# Patient Record
Sex: Female | Born: 1940
Health system: Southern US, Community
[De-identification: ages and names within clinical notes are randomized; demographics above are authoritative.]

## PROBLEM LIST (undated history)

## (undated) DIAGNOSIS — M069 Rheumatoid arthritis, unspecified: Secondary | ICD-10-CM

## (undated) DIAGNOSIS — K5730 Diverticulosis of large intestine without perforation or abscess without bleeding: Secondary | ICD-10-CM

## (undated) DIAGNOSIS — T7840XA Allergy, unspecified, initial encounter: Secondary | ICD-10-CM

## (undated) DIAGNOSIS — J309 Allergic rhinitis, unspecified: Secondary | ICD-10-CM

## (undated) DIAGNOSIS — R922 Inconclusive mammogram: Secondary | ICD-10-CM

## (undated) DIAGNOSIS — M8580 Other specified disorders of bone density and structure, unspecified site: Secondary | ICD-10-CM

## (undated) DIAGNOSIS — R9230 Dense breasts, unspecified: Secondary | ICD-10-CM

## (undated) DIAGNOSIS — N951 Menopausal and female climacteric states: Secondary | ICD-10-CM

## (undated) HISTORY — DX: Allergic rhinitis, unspecified: J30.9

## (undated) HISTORY — DX: Rheumatoid arthritis, unspecified: M06.9

## (undated) HISTORY — DX: Dense breasts, unspecified: R92.30

## (undated) HISTORY — DX: Other specified disorders of bone density and structure, unspecified site: M85.80

## (undated) HISTORY — DX: Diverticulosis of large intestine without perforation or abscess without bleeding: K57.30

## (undated) HISTORY — DX: Inconclusive mammogram: R92.2

## (undated) HISTORY — PX: NO PAST SURGERIES: SHX2092

## (undated) HISTORY — DX: Allergy, unspecified, initial encounter: T78.40XA

## (undated) HISTORY — DX: Menopausal and female climacteric states: N95.1

---

## 1995-11-12 DIAGNOSIS — N951 Menopausal and female climacteric states: Secondary | ICD-10-CM

## 1995-11-12 HISTORY — DX: Menopausal and female climacteric states: N95.1

## 1999-01-19 ENCOUNTER — Other Ambulatory Visit: Admission: RE | Admit: 1999-01-19 | Discharge: 1999-01-19 | Payer: Self-pay | Admitting: Obstetrics & Gynecology

## 1999-10-26 ENCOUNTER — Ambulatory Visit (HOSPITAL_COMMUNITY): Admission: RE | Admit: 1999-10-26 | Discharge: 1999-10-26 | Payer: Self-pay | Admitting: Family Medicine

## 1999-10-26 ENCOUNTER — Encounter: Payer: Self-pay | Admitting: Family Medicine

## 2000-04-28 ENCOUNTER — Other Ambulatory Visit: Admission: RE | Admit: 2000-04-28 | Discharge: 2000-04-28 | Payer: Self-pay | Admitting: Obstetrics & Gynecology

## 2000-06-24 ENCOUNTER — Ambulatory Visit (HOSPITAL_COMMUNITY): Admission: RE | Admit: 2000-06-24 | Discharge: 2000-06-24 | Payer: Self-pay | Admitting: Gastroenterology

## 2001-06-15 ENCOUNTER — Other Ambulatory Visit: Admission: RE | Admit: 2001-06-15 | Discharge: 2001-06-15 | Payer: Self-pay | Admitting: Obstetrics and Gynecology

## 2002-12-02 ENCOUNTER — Other Ambulatory Visit: Admission: RE | Admit: 2002-12-02 | Discharge: 2002-12-02 | Payer: Self-pay | Admitting: Obstetrics and Gynecology

## 2005-03-14 ENCOUNTER — Other Ambulatory Visit: Admission: RE | Admit: 2005-03-14 | Discharge: 2005-03-14 | Payer: Self-pay | Admitting: Obstetrics and Gynecology

## 2006-11-11 LAB — HM COLONOSCOPY

## 2007-05-25 ENCOUNTER — Ambulatory Visit (HOSPITAL_COMMUNITY): Admission: RE | Admit: 2007-05-25 | Discharge: 2007-05-25 | Payer: Self-pay | Admitting: Gastroenterology

## 2011-03-26 NOTE — Op Note (Signed)
Linda Soto, Linda Soto               ACCOUNT NO.:  [id number]   MEDICAL RECORD NO.:  [id number]          PATIENT TYPE:  AMB   LOCATION:  ENDO                         FACILITY:  [facility]   PHYSICIAN:  Anselmo Rod, M.D.  DATE OF BIRTH:  09-Jun-1941   DATE OF PROCEDURE:  05/25/2007  DATE OF DISCHARGE:                               OPERATIVE REPORT   PROCEDURE PERFORMED:  Screening colonoscopy.   ENDOSCOPIST:  Dr. Anselmo Rod.   INSTRUMENT USED:  Pentax video colonoscope.   INDICATIONS FOR PROCEDURE:  A 70 year old white female undergoing a  screening colonoscopy. The patient was found to have guaiac-positive  stools, rule out colonic polyps, masses, etc.   PREPROCEDURE PREPARATION:  Informed consent was procured from the  patient. The patient was fasted for 8 hours prior to the procedure and  prepped with 32 OsmoPrep pills the night prior to the procedure. The  risks and benefits of the procedure including a 10% miss rate of cancer  and polyp were discussed with the patient as well.   PREPROCEDURE PHYSICAL:  The patient had stable vital signs. Neck supple.  Chest clear to auscultation. S1, S2 regular. Abdomen soft with normal  bowel sounds.   DESCRIPTION OF PROCEDURE:  The patient was placed in the left lateral  decubitus position and sedated with 100 mcg of Fentanyl and 10 mg of  Versed given intravenously in slow incremental doses. Once the patient  was adequately sedate and maintained on low-flow oxygen and continuous  cardiac monitoring, the Pentax video colonoscope was advanced from the  rectum to the cecum. There was a significant amount of residual stool in  the colon especially on the right side. Multiple washes were done. There  was evidence of sigmoid diverticulosis with a few scattered diverticula  in the right colon as well.  The appendiceal orifice and ileocecal valve  were visualized after multiple washes. The terminal ileum was not  visualized.  No masses or  polyps were seen. Retroflexion in rectum  revealed no abnormalities. The patient tolerated the procedure well  without immediate complications.   IMPRESSION:  1. Pandiverticulosis with more prominent changes in the sigmoid colon.  2. Significant amount of residual stool in the colon. Small lesions      could be missed.  3. Terminal ileum not visualized.   RECOMMENDATIONS:  1. Continue high fiber diet with liberal fluid intake.  2. Repeat colonoscopy in the next 5 years with NuLytely prep. If the      patient has any abnormal symptoms in the      interim, she is to contact the office immediately for further      recommendations.  3. Outpatient follow-up in the next week for repeat guaiac testing.      Further recommendations will be made at that time.      Anselmo Rod, M.D.  Electronically Signed     JNM/MEDQ  D:  05/25/2007  T:  05/25/2007  Job:  [id number]   cc:   Hal Morales, M.D.  Fax: [phone]

## 2011-03-29 NOTE — Procedures (Signed)
[facility]  Patient:    Linda Soto, Linda Soto                     MRN: [id number] Proc. Date: 06/24/00 Attending:  Anselmo Rod, M.D. CC:         Cecilio Asper, M.D.                           Procedure Report  DATE OF BIRTH:  13-Jan-1941.  REFERRING PHYSICIAN:  Cecilio Asper, M.D.  PROCEDURE PERFORMED:  Screening flexible sigmoidoscopy in a 71 year old female.  ENDOSCOPIST:  Anselmo Rod, M.D.  INSTRUMENT USED:  Olympus video colonoscope.  INDICATIONS FOR PROCEDURE:  Screening flexible sigmoidoscopy being done in a 70 year old female rule out colonic polyps, masses, etc.  PREPROCEDURE PREPARATION:  Informed consent was procured from the patient. The patient was fasted for eight hours prior to the procedure and prepped with two Fleet enemas the morning of the procedure.  PREPROCEDURE PHYSICAL:  The patient had stable vital signs.  Neck supple. Chest clear to auscultation.  S1, S2 regular.  Abdomen soft with normal abdominal bowel sounds.  DESCRIPTION OF PROCEDURE:  The patient was placed in the left lateral decubitus position and no sedation was used.  Once the patient was adequately positioned, the Olympus video colonoscope was advanced from the rectum to the 60 cm without difficulty.  Solid stool was seen at 60 cm and the scope could not be advanced beyond this point.  Except for a few scattered diverticula, no other abnormalities were seen.  The patient tolerated the procedure well without complication.  IMPRESSION: 1. Scattered left-sided diverticulosis. 2. No masses or polyps seen. 3. No evidence of hemorrhoids.  RECOMMENDATIONS: 1. The patient has been advised to increase the fluid and fiber in her diet. 2. Outpatient follow-up advised on p.r.n. basis.  Information brochures on    diverticulosis and diverticulitis have been given to the patient for her    education.  She is to follow up with Dr. Cleatrice Burke and to contact    me for further GI problems.  DD:  06/24/00 TD:  06/24/00 Job: [id number]

## 2012-03-30 ENCOUNTER — Ambulatory Visit (INDEPENDENT_AMBULATORY_CARE_PROVIDER_SITE_OTHER): Payer: Medicare Other | Admitting: Obstetrics and Gynecology

## 2012-03-30 ENCOUNTER — Telehealth: Payer: Self-pay | Admitting: Obstetrics and Gynecology

## 2012-03-30 ENCOUNTER — Telehealth: Payer: Self-pay

## 2012-03-30 ENCOUNTER — Encounter: Payer: Self-pay | Admitting: Obstetrics and Gynecology

## 2012-03-30 VITALS — BP 122/76 | Resp 16 | Ht 67.0 in | Wt 163.0 lb

## 2012-03-30 DIAGNOSIS — R922 Inconclusive mammogram: Secondary | ICD-10-CM | POA: Insufficient documentation

## 2012-03-30 DIAGNOSIS — Z124 Encounter for screening for malignant neoplasm of cervix: Secondary | ICD-10-CM

## 2012-03-30 DIAGNOSIS — L989 Disorder of the skin and subcutaneous tissue, unspecified: Secondary | ICD-10-CM

## 2012-03-30 NOTE — Telephone Encounter (Signed)
Tc to pt per referral to Dr. Theotis Burrow office for eval of left foot lesion sched 04/28/12@10 :30. Pt agrees.

## 2012-03-30 NOTE — Progress Notes (Signed)
Addended by: Lerry Liner D on: 03/30/2012 03:22 PM   Modules accepted: Orders

## 2012-03-30 NOTE — Telephone Encounter (Signed)
Tc to pt per telephone call. Allergies/Medications updated in epic system. Pt voices understanding.

## 2012-03-30 NOTE — Progress Notes (Signed)
Last Pap: 03/03/2009 WNL: No Regular Periods:no Contraception: per pt None  Monthly Breast exam:yes Tetanus<90yrs:no Nl.Bladder Function:yes Daily BMs:yes Healthy Diet:yes Calcium:yes Mammogram:yes Exercise:Yes 4-3 x a week  Seatbelt: yes Abuse at home: no Stressful work:no Sigmoid-colonoscopy: per pt "2007 WNL" Bone Density: Yes "2011" WNL Subjective:    Linda Soto is a 71 y.o. female G1P1001 who presents for annual exam.  C/ 6 month hx of painless lesion on dorsum of left foot.  Had to return for extra mammogram views  The following portions of the patient's history were reviewed and updated as appropriate: allergies, current medications, past family history, past medical history, past social history, past surgical history and problem list.  Review of Systems Pertinent items are noted in HPI. Gastrointestinal:No change in bowel habits, no abdominal pain, no rectal bleeding Genitourinary:negative for dysuria, frequency, hematuria, nocturia and urinary incontinence    Objective:     BP 122/76  Resp 16  Ht 5\' 7"  (1.702 m)  Wt 163 lb (73.936 kg)  BMI 25.53 kg/m2  Weight:  Wt Readings from Last 1 Encounters:  03/30/12 163 lb (73.936 kg)     BMI: Body mass index is 25.53 kg/(m^2). General Appearance: Alert, appropriate appearance for age. No acute distress HEENT: Grossly normal Neck / Thyroid: Supple, no masses, nodes or enlargement Lungs: clear to auscultation bilaterally Back: No CVA tenderness Breast Exam: No masses or nodes.No dimpling, nipple retraction or discharge. Cardiovascular: Regular rate and rhythm. S1, S2, no murmur Gastrointestinal: Soft, non-tender, no masses or organomegaly Pelvic Exam: External genitalia: normal general appearance Vaginal: atrophic mucosa Cervix: normal appearance Adnexa: non palpable Uterus: normal single, nontender Rectal: no masses Rectovaginal: not indicated and see above Lymphatic Exam: Non-palpable nodes in neck,  clavicular, axillary, or inguinal regions  Skin: no rash or abnormalities Neurologic: Normal gait and speech, no tremor  Psychiatric: Alert and oriented, appropriate affect.    Urinalysis:Not done      Assessment:    Asymptomatic vaginal atrophy contact bleeding, but no sx Dense breasts Left foot lesion   Plan:    All questions answered. Diagnosis explained in detail, including differential. breast density reviewed as an independent risk factor for breast cancer  Podiatry referral Pap with HR HPV Follow-up:  for annual exam

## 2012-03-30 NOTE — Telephone Encounter (Signed)
Chandra/[facility] pt/was seen today

## 2012-04-02 LAB — PAP IG AND HPV HIGH-RISK: HPV DNA High Risk: NOT DETECTED

## 2013-03-11 LAB — HM MAMMOGRAPHY

## 2013-08-19 ENCOUNTER — Encounter: Payer: Self-pay | Admitting: Internal Medicine

## 2013-08-19 ENCOUNTER — Ambulatory Visit (INDEPENDENT_AMBULATORY_CARE_PROVIDER_SITE_OTHER): Payer: Medicare PPO | Admitting: Internal Medicine

## 2013-08-19 VITALS — BP 122/72 | HR 86 | Temp 98.2°F | Ht 67.0 in | Wt 160.0 lb

## 2013-08-19 DIAGNOSIS — Z0000 Encounter for general adult medical examination without abnormal findings: Secondary | ICD-10-CM

## 2013-08-19 NOTE — Progress Notes (Signed)
Subjective:    Patient ID: Linda Soto, female    DOB: [birth year], 71 y.o.   MRN: [id number]  HPI New patient to me and our practice, here today to establish care  Here for medicare wellness  Diet: heart healthy Physical activity: sactive Depression/mood screen: negative Hearing: intact to whispered voice Visual acuity: grossly normal, performs annual eye exam  ADLs: capable Fall risk: none Home safety: good Cognitive evaluation: intact to orientation, naming, recall and repetition EOL planning: adv directives, full code/ I agree  I have personally reviewed and have noted 1. The patient's medical and social history 2. Their use of alcohol, tobacco or illicit drugs 3. Their current medications and supplements 4. The patient's functional ability including ADL's, fall risks, home safety risks and hearing or visual impairment. 5. Diet and physical activities 6. Evidence for depression or mood disorders   Past Medical History  Diagnosis Date  . Diverticular disease of left colon   . Menopausal symptom 1997  . Dense breasts     f/u mammo rec 2013 because of same  . Allergic rhinitis    Family History  Problem Relation Age of Onset  . Prostate cancer Father   . Arthritis Mother   . Osteoporosis Mother   . Lung cancer Maternal Aunt   . Breast cancer Maternal Aunt   . Uterine cancer Maternal Aunt   . Myasthenia gravis     History  Substance Use Topics  . Smoking status: Never Smoker   . Smokeless tobacco: Never Used     Comment: divorced, lives alone - retired from [employer] in 2011  . Alcohol Use: No   Review of Systems Constitutional: Negative for fever or weight change.  Respiratory: Negative for cough and shortness of breath.   Cardiovascular: Negative for chest pain or palpitations.  Gastrointestinal: Negative for abdominal pain, no bowel changes.  Musculoskeletal: Negative for gait problem or joint swelling.  Skin: Negative for rash.   Neurological: Negative for dizziness or headache.  No other specific complaints in a complete review of systems (except as listed in HPI above).     Objective:   Physical Exam BP 122/72  Pulse 86  Temp(Src) 98.2 F (36.8 C) (Oral)  Ht 5\' 7"  (1.702 m)  Wt 160 lb (72.576 kg)  BMI 25.05 kg/m2  SpO2 95% Wt Readings from Last 3 Encounters:  08/19/13 160 lb (72.576 kg)  03/30/12 163 lb (73.936 kg)   Constitutional: She appears well-developed and well-nourished. No distress.  HENT: Head: Normocephalic and atraumatic. Ears: B TMs ok, no erythema or effusion; Nose: Nose normal. Mouth/Throat: Oropharynx is clear and moist. No oropharyngeal exudate.  Eyes: Conjunctivae and EOM are normal. Pupils are equal, round, and reactive to light. No scleral icterus.  Neck: Normal range of motion. Neck supple. No JVD present. No thyromegaly present.  Cardiovascular: Normal rate, regular rhythm and normal heart sounds.  No murmur heard. No BLE edema. Pulmonary/Chest: Effort normal and breath sounds normal. No respiratory distress. She has no wheezes.  Abdominal: Soft. Bowel sounds are normal. She exhibits no distension. There is no tenderness. no masses Musculoskeletal: Normal range of motion, no joint effusions. No gross deformities Neurological: She is alert and oriented to person, place, and time. No cranial nerve deficit. Coordination, balance, strength, speech and gait are normal.  Skin: Skin is warm and dry. No rash noted. No erythema.  Psychiatric: She has a normal mood and affect. Her behavior is normal. Judgment and thought content normal.  No results found for this basename: WBC,  HGB,  HCT,  PLT,  GLUCOSE,  CHOL,  TRIG,  HDL,  LDLDIRECT,  LDLCALC,  ALT,  AST,  NA,  K,  CL,  CREATININE,  BUN,  CO2,  TSH,  PSA,  INR,  GLUF,  HGBA1C,  MICROALBUR      Assessment & Plan:   CPX/AWV/v70.0 - Today patient counseled on age appropriate routine health concerns for screening and prevention, each  reviewed and up to date or declined. Immunizations reviewed and up to date or declined. Labs ordered and reviewed. Risk factors for depression reviewed and negative. Hearing function and visual acuity are intact. ADLs screened and addressed as needed. Functional ability and level of safety reviewed and appropriate. Education, counseling and referrals performed based on assessed risks today. Patient provided with a copy of personalized plan for preventive services.

## 2013-08-19 NOTE — Patient Instructions (Addendum)
It was good to see you today. We have reviewed your prior records including labs and tests today Health Maintenance reviewed - all recommended immunizations and age-appropriate screenings are up-to-date. Return for labs when you're fasting. Your results will be released to MyChart (or called to you) after review, usually within 72hours after test completion. If any changes need to be made, you will be notified at that same time. Also return for tetanus update and flu shot after your vacation Medications reviewed and updated, no changes recommended at this time. we will send to Dr. Loreta Ave for "release of records" as discussed today. Will plan for different GI provider at your next scheduled colonoscopy as discussed Please schedule followup in 12 months for annual exam/labs, call sooner if problems.    Health Maintenance, Females A healthy lifestyle and preventative care can promote health and wellness.  Maintain regular health, dental, and eye exams.  Eat a healthy diet. Foods like vegetables, fruits, whole grains, low-fat dairy products, and lean protein foods contain the nutrients you need without too many calories. Decrease your intake of foods high in solid fats, added sugars, and salt. Get information about a proper diet from your caregiver, if necessary.  Regular physical exercise is one of the most important things you can do for your health. Most adults should get at least 150 minutes of moderate-intensity exercise (any activity that increases your heart rate and causes you to sweat) each week. In addition, most adults need muscle-strengthening exercises on 2 or more days a week.   Maintain a healthy weight. The body mass index (BMI) is a screening tool to identify possible weight problems. It provides an estimate of body fat based on height and weight. Your caregiver can help determine your BMI, and can help you achieve or maintain a healthy weight. For adults 20 years and older:  A BMI  below 18.5 is considered underweight.  A BMI of 18.5 to 24.9 is normal.  A BMI of 25 to 29.9 is considered overweight.  A BMI of 30 and above is considered obese.  Maintain normal blood lipids and cholesterol by exercising and minimizing your intake of saturated fat. Eat a balanced diet with plenty of fruits and vegetables. Blood tests for lipids and cholesterol should begin at age 32 and be repeated every 5 years. If your lipid or cholesterol levels are high, you are over 50, or you are a high risk for heart disease, you may need your cholesterol levels checked more frequently.Ongoing high lipid and cholesterol levels should be treated with medicines if diet and exercise are not effective.  If you smoke, find out from your caregiver how to quit. If you do not use tobacco, do not start.  If you are pregnant, do not drink alcohol. If you are breastfeeding, be very cautious about drinking alcohol. If you are not pregnant and choose to drink alcohol, do not exceed 1 drink per day. One drink is considered to be 12 ounces (355 mL) of beer, 5 ounces (148 mL) of wine, or 1.5 ounces (44 mL) of liquor.  Avoid use of street drugs. Do not share needles with anyone. Ask for help if you need support or instructions about stopping the use of drugs.  High blood pressure causes heart disease and increases the risk of stroke. Blood pressure should be checked at least every 1 to 2 years. Ongoing high blood pressure should be treated with medicines, if weight loss and exercise are not effective.  If you are  20 to 70 years old, ask your caregiver if you should take aspirin to prevent strokes.  Diabetes screening involves taking a blood sample to check your fasting blood sugar level. This should be done once every 3 years, after age 33, if you are within normal weight and without risk factors for diabetes. Testing should be considered at a younger age or be carried out more frequently if you are overweight and have  at least 1 risk factor for diabetes.  Breast cancer screening is essential preventative care for women. You should practice "breast self-awareness." This means understanding the normal appearance and feel of your breasts and may include breast self-examination. Any changes detected, no matter how small, should be reported to a caregiver. Women in their 46s and 30s should have a clinical breast exam (CBE) by a caregiver as part of a regular health exam every 1 to 3 years. After age 54, women should have a CBE every year. Starting at age 26, women should consider having a mammogram (breast X-ray) every year. Women who have a family history of breast cancer should talk to their caregiver about genetic screening. Women at a high risk of breast cancer should talk to their caregiver about having an MRI and a mammogram every year.  The Pap test is a screening test for cervical cancer. Women should have a Pap test starting at age 81. Between ages 36 and 43, Pap tests should be repeated every 2 years. Beginning at age 63, you should have a Pap test every 3 years as long as the past 3 Pap tests have been normal. If you had a hysterectomy for a problem that was not cancer or a condition that could lead to cancer, then you no longer need Pap tests. If you are between ages 89 and 22, and you have had normal Pap tests going back 10 years, you no longer need Pap tests. If you have had past treatment for cervical cancer or a condition that could lead to cancer, you need Pap tests and screening for cancer for at least 20 years after your treatment. If Pap tests have been discontinued, risk factors (such as a new sexual partner) need to be reassessed to determine if screening should be resumed. Some women have medical problems that increase the chance of getting cervical cancer. In these cases, your caregiver may recommend more frequent screening and Pap tests.  The human papillomavirus (HPV) test is an additional test that may  be used for cervical cancer screening. The HPV test looks for the virus that can cause the cell changes on the cervix. The cells collected during the Pap test can be tested for HPV. The HPV test could be used to screen women aged 53 years and older, and should be used in women of any age who have unclear Pap test results. After the age of 22, women should have HPV testing at the same frequency as a Pap test.  Colorectal cancer can be detected and often prevented. Most routine colorectal cancer screening begins at the age of 13 and continues through age 39. However, your caregiver may recommend screening at an earlier age if you have risk factors for colon cancer. On a yearly basis, your caregiver may provide home test kits to check for hidden blood in the stool. Use of a small camera at the end of a tube, to directly examine the colon (sigmoidoscopy or colonoscopy), can detect the earliest forms of colorectal cancer. Talk to your caregiver about this  at age 66, when routine screening begins. Direct examination of the colon should be repeated every 5 to 10 years through age 27, unless early forms of pre-cancerous polyps or small growths are found.  Hepatitis C blood testing is recommended for all people born from [birth year] through 1963 and any individual with known risks for hepatitis C.  Practice safe sex. Use condoms and avoid high-risk sexual practices to reduce the spread of sexually transmitted infections (STIs). Sexually active women aged 59 and younger should be checked for Chlamydia, which is a common sexually transmitted infection. Older women with new or multiple partners should also be tested for Chlamydia. Testing for other STIs is recommended if you are sexually active and at increased risk.  Osteoporosis is a disease in which the bones lose minerals and strength with aging. This can result in serious bone fractures. The risk of osteoporosis can be identified using a bone density scan. Women ages 28  and over and women at risk for fractures or osteoporosis should discuss screening with their caregivers. Ask your caregiver whether you should be taking a calcium supplement or vitamin D to reduce the rate of osteoporosis.  Menopause can be associated with physical symptoms and risks. Hormone replacement therapy is available to decrease symptoms and risks. You should talk to your caregiver about whether hormone replacement therapy is right for you.  Use sunscreen with a sun protection factor (SPF) of 30 or greater. Apply sunscreen liberally and repeatedly throughout the day. You should seek shade when your shadow is shorter than you. Protect yourself by wearing long sleeves, pants, a wide-brimmed hat, and sunglasses year round, whenever you are outdoors.  Notify your caregiver of new moles or changes in moles, especially if there is a change in shape or color. Also notify your caregiver if a mole is larger than the size of a pencil eraser.  Stay current with your immunizations. Document Released: 05/13/2011 Document Revised: 01/20/2012 Document Reviewed: 05/13/2011 [facility] Patient Information 2014 [place], [place].

## 2013-08-31 ENCOUNTER — Other Ambulatory Visit (INDEPENDENT_AMBULATORY_CARE_PROVIDER_SITE_OTHER): Payer: Medicare Other

## 2013-08-31 DIAGNOSIS — Z0000 Encounter for general adult medical examination without abnormal findings: Secondary | ICD-10-CM

## 2013-08-31 LAB — CBC WITH DIFFERENTIAL/PLATELET
Basophils Absolute: 0 10*3/uL (ref 0.0–0.1)
Basophils Relative: 0.7 % (ref 0.0–3.0)
Eosinophils Absolute: 0.1 10*3/uL (ref 0.0–0.7)
Eosinophils Relative: 2.5 % (ref 0.0–5.0)
HCT: 43.3 % (ref 36.0–46.0)
Hemoglobin: 14.9 g/dL (ref 12.0–15.0)
Lymphocytes Relative: 27.7 % (ref 12.0–46.0)
Lymphs Abs: 1.5 10*3/uL (ref 0.7–4.0)
MCHC: 34.5 g/dL (ref 30.0–36.0)
MCV: 91.5 fl (ref 78.0–100.0)
Monocytes Absolute: 0.4 10*3/uL (ref 0.1–1.0)
Monocytes Relative: 7.2 % (ref 3.0–12.0)
Neutro Abs: 3.3 10*3/uL (ref 1.4–7.7)
Neutrophils Relative %: 61.9 % (ref 43.0–77.0)
Platelets: 208 10*3/uL (ref 150.0–400.0)
RBC: 4.74 Mil/uL (ref 3.87–5.11)
RDW: 13.6 % (ref 11.5–14.6)
WBC: 5.4 10*3/uL (ref 4.5–10.5)

## 2013-08-31 LAB — LIPID PANEL
Cholesterol: 158 mg/dL (ref 0–200)
HDL: 74.1 mg/dL (ref >39.00)
LDL Cholesterol: 78 mg/dL (ref 0–99)
Total CHOL/HDL Ratio: 2
Triglycerides: 32 mg/dL (ref 0.0–149.0)
VLDL: 6.4 mg/dL (ref 0.0–40.0)

## 2013-08-31 LAB — BASIC METABOLIC PANEL
BUN: 14 mg/dL (ref 6–23)
CO2: 29 mEq/L (ref 19–32)
Calcium: 9 mg/dL (ref 8.4–10.5)
Chloride: 107 mEq/L (ref 96–112)
Creatinine, Ser: 0.7 mg/dL (ref 0.4–1.2)
GFR: 82.41 mL/min (ref >60.00)
Glucose, Bld: 100 mg/dL — ABNORMAL HIGH (ref 70–99)
Potassium: 4.5 mEq/L (ref 3.5–5.1)
Sodium: 142 mEq/L (ref 135–145)

## 2013-08-31 LAB — HEPATIC FUNCTION PANEL
ALT: 21 U/L (ref 0–35)
AST: 23 U/L (ref 0–37)
Albumin: 3.9 g/dL (ref 3.5–5.2)
Alkaline Phosphatase: 90 U/L (ref 39–117)
Bilirubin, Direct: 0.1 mg/dL (ref 0.0–0.3)
Total Bilirubin: 0.7 mg/dL (ref 0.3–1.2)
Total Protein: 7 g/dL (ref 6.0–8.3)

## 2013-08-31 LAB — TSH: TSH: 3.17 u[IU]/mL (ref 0.35–5.50)

## 2013-09-01 ENCOUNTER — Ambulatory Visit: Payer: Medicare Other

## 2013-09-01 ENCOUNTER — Ambulatory Visit (INDEPENDENT_AMBULATORY_CARE_PROVIDER_SITE_OTHER): Payer: Medicare Other

## 2013-09-01 DIAGNOSIS — Z23 Encounter for immunization: Secondary | ICD-10-CM

## 2013-09-01 NOTE — Addendum Note (Signed)
Addended by: Bethann Punches E on: 09/01/2013 09:42 AM   Modules accepted: Orders

## 2013-12-17 LAB — LIPID PANEL
Cholesterol: 172 mg/dL (ref 0–200)
HDL: 71 mg/dL — AB (ref 35–70)
LDL Cholesterol: 89 mg/dL
Triglycerides: 56 mg/dL (ref 40–160)

## 2013-12-17 LAB — HEPATIC FUNCTION PANEL
ALT: 15 U/L (ref 7–35)
AST: 22 U/L (ref 13–35)
Alkaline Phosphatase: 93 U/L (ref 25–125)
Bilirubin, Total: 0.6 mg/dL

## 2013-12-17 LAB — BASIC METABOLIC PANEL
BUN: 14 mg/dL (ref 4–21)
Creatinine: 0.8 mg/dL (ref 0.5–1.1)
Glucose: 85 mg/dL

## 2013-12-17 LAB — HEMOGLOBIN A1C: Hgb A1c MFr Bld: 5.6 % (ref 4.0–6.0)

## 2014-01-21 ENCOUNTER — Encounter: Payer: Self-pay | Admitting: Internal Medicine

## 2014-09-12 ENCOUNTER — Encounter: Payer: Self-pay | Admitting: Internal Medicine

## 2014-09-15 ENCOUNTER — Ambulatory Visit: Payer: Medicare PPO

## 2014-09-19 ENCOUNTER — Ambulatory Visit: Payer: Medicare PPO

## 2014-09-20 ENCOUNTER — Ambulatory Visit (INDEPENDENT_AMBULATORY_CARE_PROVIDER_SITE_OTHER): Payer: Medicare PPO | Admitting: *Deleted

## 2014-09-20 ENCOUNTER — Ambulatory Visit: Payer: Medicare PPO

## 2014-09-20 DIAGNOSIS — Z23 Encounter for immunization: Secondary | ICD-10-CM

## 2015-05-08 ENCOUNTER — Other Ambulatory Visit: Payer: Self-pay

## 2015-07-05 DIAGNOSIS — Z1231 Encounter for screening mammogram for malignant neoplasm of breast: Secondary | ICD-10-CM | POA: Diagnosis not present

## 2015-08-16 DIAGNOSIS — M85859 Other specified disorders of bone density and structure, unspecified thigh: Secondary | ICD-10-CM | POA: Diagnosis not present

## 2015-08-16 DIAGNOSIS — N952 Postmenopausal atrophic vaginitis: Secondary | ICD-10-CM | POA: Diagnosis not present

## 2015-08-16 DIAGNOSIS — Z01419 Encounter for gynecological examination (general) (routine) without abnormal findings: Secondary | ICD-10-CM | POA: Diagnosis not present

## 2015-08-30 DIAGNOSIS — M85859 Other specified disorders of bone density and structure, unspecified thigh: Secondary | ICD-10-CM | POA: Diagnosis not present

## 2015-08-30 LAB — HM DEXA SCAN: HM Dexa Scan: -1.8

## 2015-09-04 ENCOUNTER — Encounter: Payer: Self-pay | Admitting: Internal Medicine

## 2015-09-04 ENCOUNTER — Other Ambulatory Visit (INDEPENDENT_AMBULATORY_CARE_PROVIDER_SITE_OTHER): Payer: Medicare PPO

## 2015-09-04 ENCOUNTER — Ambulatory Visit (INDEPENDENT_AMBULATORY_CARE_PROVIDER_SITE_OTHER): Payer: Medicare PPO | Admitting: Internal Medicine

## 2015-09-04 VITALS — BP 120/68 | HR 76 | Temp 98.5°F | Ht 67.0 in | Wt 163.2 lb

## 2015-09-04 DIAGNOSIS — M8580 Other specified disorders of bone density and structure, unspecified site: Secondary | ICD-10-CM | POA: Insufficient documentation

## 2015-09-04 DIAGNOSIS — Z0000 Encounter for general adult medical examination without abnormal findings: Secondary | ICD-10-CM | POA: Diagnosis not present

## 2015-09-04 DIAGNOSIS — Z23 Encounter for immunization: Secondary | ICD-10-CM

## 2015-09-04 LAB — URINALYSIS, ROUTINE W REFLEX MICROSCOPIC
Bilirubin Urine: NEGATIVE
Ketones, ur: NEGATIVE
Leukocytes, UA: NEGATIVE
Nitrite: NEGATIVE
RBC / HPF: NONE SEEN (ref 0–?)
Specific Gravity, Urine: <=1.005 — AB (ref 1.000–1.030)
Total Protein, Urine: NEGATIVE
Urine Glucose: NEGATIVE
Urobilinogen, UA: 0.2 (ref 0.0–1.0)
WBC, UA: NONE SEEN (ref 0–?)
pH: 6.5 (ref 5.0–8.0)

## 2015-09-04 LAB — CBC WITH DIFFERENTIAL/PLATELET
Basophils Absolute: 0 10*3/uL (ref 0.0–0.1)
Basophils Relative: 0.5 % (ref 0.0–3.0)
Eosinophils Absolute: 0.2 10*3/uL (ref 0.0–0.7)
Eosinophils Relative: 2.5 % (ref 0.0–5.0)
HCT: 45.7 % (ref 36.0–46.0)
Hemoglobin: 15.4 g/dL — ABNORMAL HIGH (ref 12.0–15.0)
Lymphocytes Relative: 28.7 % (ref 12.0–46.0)
Lymphs Abs: 1.9 10*3/uL (ref 0.7–4.0)
MCHC: 33.8 g/dL (ref 30.0–36.0)
MCV: 91.7 fl (ref 78.0–100.0)
Monocytes Absolute: 0.5 10*3/uL (ref 0.1–1.0)
Monocytes Relative: 7.7 % (ref 3.0–12.0)
Neutro Abs: 4.1 10*3/uL (ref 1.4–7.7)
Neutrophils Relative %: 60.6 % (ref 43.0–77.0)
Platelets: 236 10*3/uL (ref 150.0–400.0)
RBC: 4.99 Mil/uL (ref 3.87–5.11)
RDW: 13.7 % (ref 11.5–15.5)
WBC: 6.7 10*3/uL (ref 4.0–10.5)

## 2015-09-04 LAB — VITAMIN D 25 HYDROXY (VIT D DEFICIENCY, FRACTURES): VITD: 37.6 ng/mL (ref 30.00–100.00)

## 2015-09-04 LAB — HEPATIC FUNCTION PANEL
ALT: 12 U/L (ref 0–35)
AST: 16 U/L (ref 0–37)
Albumin: 4.2 g/dL (ref 3.5–5.2)
Alkaline Phosphatase: 95 U/L (ref 39–117)
Bilirubin, Direct: 0 mg/dL (ref 0.0–0.3)
Total Bilirubin: 0.4 mg/dL (ref 0.2–1.2)
Total Protein: 7.6 g/dL (ref 6.0–8.3)

## 2015-09-04 LAB — LIPID PANEL
Cholesterol: 171 mg/dL (ref 0–200)
HDL: 63.8 mg/dL (ref >39.00)
LDL Cholesterol: 96 mg/dL (ref 0–99)
NonHDL: 107.3
Total CHOL/HDL Ratio: 3
Triglycerides: 57 mg/dL (ref 0.0–149.0)
VLDL: 11.4 mg/dL (ref 0.0–40.0)

## 2015-09-04 LAB — BASIC METABOLIC PANEL
BUN: 13 mg/dL (ref 6–23)
CO2: 29 mEq/L (ref 19–32)
Calcium: 9.9 mg/dL (ref 8.4–10.5)
Chloride: 104 mEq/L (ref 96–112)
Creatinine, Ser: 0.78 mg/dL (ref 0.40–1.20)
GFR: 93.31 mL/min (ref >60.00)
Glucose, Bld: 75 mg/dL (ref 70–99)
Potassium: 4.3 mEq/L (ref 3.5–5.1)
Sodium: 141 mEq/L (ref 135–145)

## 2015-09-04 LAB — TSH: TSH: 2.47 u[IU]/mL (ref 0.35–4.50)

## 2015-09-04 MED ORDER — ZOSTER VACCINE LIVE 19400 UNT/0.65ML ~~LOC~~ SOLR: 0.6500 mL | Freq: Once | SUBCUTANEOUS | Status: DC

## 2015-09-04 NOTE — Patient Instructions (Addendum)
It was good to see you today.  We have reviewed your prior records including labs and tests today  Annual flu shot and one time Prevnar 13 pneumonia vaccination updated today.  Printed prescription for shingles vaccine signed and given to you today. Please wait at least 6 weeks before having this vaccination administered and have your pharmacy notify us once this is done.  All other Health Maintenance reviewed and other recommended immunizations and age-appropriate screenings are up-to-date.  Test(s) ordered today. Your results will be released to MyChart (or called to you) after review, usually within 72hours after test completion. If any changes need to be made, you will be notified at that same time.  Medications reviewed and updated, no changes recommended at this time.  Please schedule followup in 12 months for annual exam and labs, call sooner if problems.  Health Maintenance, Female Adopting a healthy lifestyle and getting preventive care can go a long way to promote health and wellness. Talk with your health care provider about what schedule of regular examinations is right for you. This is a good chance for you to check in with your provider about disease prevention and staying healthy. In between checkups, there are plenty of things you can do on your own. Experts have done a lot of research about which lifestyle changes and preventive measures are most likely to keep you healthy. Ask your health care provider for more information. WEIGHT AND DIET  Eat a healthy diet  Be sure to include plenty of vegetables, fruits, low-fat dairy products, and lean protein.  Do not eat a lot of foods high in solid fats, added sugars, or salt.  Get regular exercise. This is one of the most important things you can do for your health.  Most adults should exercise for at least 150 minutes each week. The exercise should increase your heart rate and make you sweat (moderate-intensity  exercise).  Most adults should also do strengthening exercises at least twice a week. This is in addition to the moderate-intensity exercise.  Maintain a healthy weight  Body mass index (BMI) is a measurement that can be used to identify possible weight problems. It estimates body fat based on height and weight. Your health care provider can help determine your BMI and help you achieve or maintain a healthy weight.  For females 16 years of age and older:   A BMI below 18.5 is considered underweight.  A BMI of 18.5 to 24.9 is normal.  A BMI of 25 to 29.9 is considered overweight.  A BMI of 30 and above is considered obese.  Watch levels of cholesterol and blood lipids  You should start having your blood tested for lipids and cholesterol at 72 years of age, then have this test every 5 years.  You may need to have your cholesterol levels checked more often if:  Your lipid or cholesterol levels are high.  You are older than 72 years of age.  You are at high risk for heart disease.  CANCER SCREENING   Lung Cancer  Lung cancer screening is recommended for adults 104-69 years old who are at high risk for lung cancer because of a history of smoking.  A yearly low-dose CT scan of the lungs is recommended for people who:  Currently smoke.  Have quit within the past 15 years.  Have at least a 30-pack-year history of smoking. A pack year is smoking an average of one pack of cigarettes a day for 1 year.  Yearly screening should continue until it has been 15 years since you quit.  Yearly screening should stop if you develop a health problem that would prevent you from having lung cancer treatment.  Breast Cancer  Practice breast self-awareness. This means understanding how your breasts normally appear and feel.  It also means doing regular breast self-exams. Let your health care provider know about any changes, no matter how small.  If you are in your 20s or 30s, you should  have a clinical breast exam (CBE) by a health care provider every 1-3 years as part of a regular health exam.  If you are 72 or older, have a CBE every year. Also consider having a breast X-ray (mammogram) every year.  If you have a family history of breast cancer, talk to your health care provider about genetic screening.  If you are at high risk for breast cancer, talk to your health care provider about having an MRI and a mammogram every year.  Breast cancer gene (BRCA) assessment is recommended for women who have family members with BRCA-related cancers. BRCA-related cancers include:  Breast.  Ovarian.  Tubal.  Peritoneal cancers.  Results of the assessment will determine the need for genetic counseling and BRCA1 and BRCA2 testing. Cervical Cancer Your health care provider may recommend that you be screened regularly for cancer of the pelvic organs (ovaries, uterus, and vagina). This screening involves a pelvic examination, including checking for microscopic changes to the surface of your cervix (Pap test). You may be encouraged to have this screening done every 3 years, beginning at age 44.  For women ages 7-65, health care providers may recommend pelvic exams and Pap testing every 3 years, or they may recommend the Pap and pelvic exam, combined with testing for human papilloma virus (HPV), every 5 years. Some types of HPV increase your risk of cervical cancer. Testing for HPV may also be done on women of any age with unclear Pap test results.  Other health care providers may not recommend any screening for nonpregnant women who are considered low risk for pelvic cancer and who do not have symptoms. Ask your health care provider if a screening pelvic exam is right for you.  If you have had past treatment for cervical cancer or a condition that could lead to cancer, you need Pap tests and screening for cancer for at least 20 years after your treatment. If Pap tests have been  discontinued, your risk factors (such as having a new sexual partner) need to be reassessed to determine if screening should resume. Some women have medical problems that increase the chance of getting cervical cancer. In these cases, your health care provider may recommend more frequent screening and Pap tests. Colorectal Cancer  This type of cancer can be detected and often prevented.  Routine colorectal cancer screening usually begins at 72 years of age and continues through 72 years of age.  Your health care provider may recommend screening at an earlier age if you have risk factors for colon cancer.  Your health care provider may also recommend using home test kits to check for hidden blood in the stool.  A small camera at the end of a tube can be used to examine your colon directly (sigmoidoscopy or colonoscopy). This is done to check for the earliest forms of colorectal cancer.  Routine screening usually begins at age 98.  Direct examination of the colon should be repeated every 5-10 years through 72 years of age. However, you  may need to be screened more often if early forms of precancerous polyps or small growths are found. Skin Cancer  Check your skin from head to toe regularly.  Tell your health care provider about any new moles or changes in moles, especially if there is a change in a mole's shape or color.  Also tell your health care provider if you have a mole that is larger than the size of a pencil eraser.  Always use sunscreen. Apply sunscreen liberally and repeatedly throughout the day.  Protect yourself by wearing long sleeves, pants, a wide-brimmed hat, and sunglasses whenever you are outside. HEART DISEASE, DIABETES, AND HIGH BLOOD PRESSURE   High blood pressure causes heart disease and increases the risk of stroke. High blood pressure is more likely to develop in:  People who have blood pressure in the high end of the normal range (130-139/85-89 mm Hg).  People  who are overweight or obese.  People who are African American.  If you are 96-25 years of age, have your blood pressure checked every 3-5 years. If you are 4 years of age or older, have your blood pressure checked every year. You should have your blood pressure measured twice--once when you are at a hospital or clinic, and once when you are not at a hospital or clinic. Record the average of the two measurements. To check your blood pressure when you are not at a hospital or clinic, you can use:  An automated blood pressure machine at a pharmacy.  A home blood pressure monitor.  If you are between 17 years and 24 years old, ask your health care provider if you should take aspirin to prevent strokes.  Have regular diabetes screenings. This involves taking a blood sample to check your fasting blood sugar level.  If you are at a normal weight and have a low risk for diabetes, have this test once every three years after 72 years of age.  If you are overweight and have a high risk for diabetes, consider being tested at a younger age or more often. PREVENTING INFECTION  Hepatitis B  If you have a higher risk for hepatitis B, you should be screened for this virus. You are considered at high risk for hepatitis B if:  You were born in a country where hepatitis B is common. Ask your health care provider which countries are considered high risk.  Your parents were born in a high-risk country, and you have not been immunized against hepatitis B (hepatitis B vaccine).  You have HIV or AIDS.  You use needles to inject street drugs.  You live with someone who has hepatitis B.  You have had sex with someone who has hepatitis B.  You get hemodialysis treatment.  You take certain medicines for conditions, including cancer, organ transplantation, and autoimmune conditions. Hepatitis C  Blood testing is recommended for:  Everyone born from [birth year] through 1963.  Anyone with known risk factors for  hepatitis C. Sexually transmitted infections (STIs)  You should be screened for sexually transmitted infections (STIs) including gonorrhea and chlamydia if:  You are sexually active and are younger than 72 years of age.  You are older than 72 years of age and your health care provider tells you that you are at risk for this type of infection.  Your sexual activity has changed since you were last screened and you are at an increased risk for chlamydia or gonorrhea. Ask your health care provider if you are at risk.  If you do not have HIV, but are at risk, it may be recommended that you take a prescription medicine daily to prevent HIV infection. This is called pre-exposure prophylaxis (PrEP). You are considered at risk if:  You are sexually active and do not regularly use condoms or know the HIV status of your partner(s).  You take drugs by injection.  You are sexually active with a partner who has HIV. Talk with your health care provider about whether you are at high risk of being infected with HIV. If you choose to begin PrEP, you should first be tested for HIV. You should then be tested every 3 months for as long as you are taking PrEP.  PREGNANCY   If you are premenopausal and you may become pregnant, ask your health care provider about preconception counseling.  If you may become pregnant, take 400 to 800 micrograms (mcg) of folic acid every day.  If you want to prevent pregnancy, talk to your health care provider about birth control (contraception). OSTEOPOROSIS AND MENOPAUSE   Osteoporosis is a disease in which the bones lose minerals and strength with aging. This can result in serious bone fractures. Your risk for osteoporosis can be identified using a bone density scan.  If you are 92 years of age or older, or if you are at risk for osteoporosis and fractures, ask your health care provider if you should be screened.  Ask your health care provider whether you should take a  calcium or vitamin D supplement to lower your risk for osteoporosis.  Menopause may have certain physical symptoms and risks.  Hormone replacement therapy may reduce some of these symptoms and risks. Talk to your health care provider about whether hormone replacement therapy is right for you.  HOME CARE INSTRUCTIONS   Schedule regular health, dental, and eye exams.  Stay current with your immunizations.   Do not use any tobacco products including cigarettes, chewing tobacco, or electronic cigarettes.  If you are pregnant, do not drink alcohol.  If you are breastfeeding, limit how much and how often you drink alcohol.  Limit alcohol intake to no more than 1 drink per day for nonpregnant women. One drink equals 12 ounces of beer, 5 ounces of wine, or 1 ounces of hard liquor.  Do not use street drugs.  Do not share needles.  Ask your health care provider for help if you need support or information about quitting drugs.  Tell your health care provider if you often feel depressed.  Tell your health care provider if you have ever been abused or do not feel safe at home.   This information is not intended to replace advice given to you by your health care provider. Make sure you discuss any questions you have with your health care provider.   Document Released: 05/13/2011 Document Revised: 11/18/2014 Document Reviewed: 09/29/2013 Elsevier Interactive Patient Education [employer].

## 2015-09-04 NOTE — Progress Notes (Signed)
Pre visit review using our clinic review tool, if applicable. No additional management support is needed unless otherwise documented below in the visit note. 

## 2015-09-04 NOTE — Progress Notes (Signed)
Subjective:    Patient ID: Linda Soto, female    DOB: June 30, 1941, 74 y.o.   MRN: [id number]  HPI   Here for medicare wellness  Diet: heart healthy  Physical activity: active - hikes/walks 3-4x/wek, >1hr/episode Depression/mood screen: negative Hearing: intact to whispered voice Visual acuity: grossly normal, performs annual eye exam  ADLs: capable Fall risk: none Home safety: good Cognitive evaluation: intact to orientation, naming, recall and repetition EOL planning: adv directives, full code/ I agree  I have personally reviewed and have noted 1. The patient's medical and social history 2. Their use of alcohol, tobacco or illicit drugs 3. Their current medications and supplements 4. The patient's functional ability including ADL's, fall risks, home safety risks and hearing or visual impairment. 5. Diet and physical activities 6. Evidence for depression or mood disorders  Also reviewed chronic medical conditions, interval events and current concerns   Past Medical History  Diagnosis Date  . Diverticular disease of left colon   . Menopausal symptom 1997  . Dense breasts     f/u mammo rec 2013 because of same  . Allergic rhinitis   . Osteopenia     at femur, follows DEXA with gyn q 40yrs   Family History  Problem Relation Age of Onset  . Prostate cancer Father 14  . Arthritis Mother   . Osteoporosis Mother     vertebral compression fx  . Lung cancer Maternal Aunt   . Breast cancer Maternal Aunt   . Uterine cancer Maternal Aunt   . Myasthenia gravis Mother 52   Social History  Substance Use Topics  . Smoking status: Never Smoker   . Smokeless tobacco: Never Used  . Alcohol Use: No    Review of Systems  Constitutional: Negative for fatigue and unexpected weight change.  Respiratory: Negative for cough, shortness of breath and wheezing.   Cardiovascular: Negative for chest pain, palpitations and leg swelling.  Gastrointestinal: Negative for nausea,  abdominal pain and diarrhea.  Neurological: Negative for dizziness, weakness, light-headedness and headaches.  Psychiatric/Behavioral: Negative for dysphoric mood. The patient is not nervous/anxious.   All other systems reviewed and are negative.   Patient Care Team: Newt Lukes, MD as PCP - General (Internal Medicine) Hal Morales, MD (Obstetrics and Gynecology) Charna Elizabeth, MD (Gastroenterology) Mateo Flow, MD (Ophthalmology)     Objective:    Physical Exam  Constitutional: She is oriented to person, place, and time. She appears well-developed and well-nourished. No distress.  HENT:  Head: Normocephalic and atraumatic.  Right Ear: External ear normal.  Left Ear: External ear normal.  Nose: Nose normal.  Mouth/Throat: Oropharynx is clear and moist. No oropharyngeal exudate.  nonobst cerumen R ear B TMJ crepitation, no locking sx per pt but sore L opening in AM  Eyes: EOM are normal. Pupils are equal, round, and reactive to light. Right eye exhibits no discharge. Left eye exhibits no discharge. No scleral icterus.  Neck: Normal range of motion. Neck supple. No JVD present. No tracheal deviation present. No thyromegaly present.  Cardiovascular: Normal rate, regular rhythm, normal heart sounds and intact distal pulses.  Exam reveals no friction rub.   No murmur heard. Pulmonary/Chest: Effort normal and breath sounds normal. No respiratory distress. She has no wheezes. She has no rales. She exhibits no tenderness.  Abdominal: Soft. Bowel sounds are normal. She exhibits no distension and no mass. There is no tenderness. There is no rebound and no guarding.  Genitourinary:  Defer to gyn  Musculoskeletal: Normal range of motion.  No gross deformities  Lymphadenopathy:    She has no cervical adenopathy.  Neurological: She is alert and oriented to person, place, and time. She has normal reflexes. No cranial nerve deficit.  Skin: Skin is warm and dry. No rash noted. She  is not diaphoretic. No erythema.  Psychiatric: She has a normal mood and affect. Her behavior is normal. Judgment and thought content normal.  Nursing note and vitals reviewed.   BP 120/68 mmHg  Pulse 76  Temp(Src) 98.5 F (36.9 C) (Oral)  Ht 5\' 7"  (1.702 m)  Wt 163 lb 4 oz (74.05 kg)  BMI 25.56 kg/m2  SpO2 94% Wt Readings from Last 3 Encounters:  09/04/15 163 lb 4 oz (74.05 kg)  08/19/13 160 lb (72.576 kg)  03/30/12 163 lb (73.936 kg)    Lab Results  Component Value Date   WBC 5.4 08/31/2013   HGB 14.9 08/31/2013   HCT 43.3 08/31/2013   PLT 208.0 08/31/2013   GLUCOSE 100* 08/31/2013   CHOL 172 12/17/2013   TRIG 56 12/17/2013   HDL 71* 12/17/2013   LDLCALC 89 12/17/2013   ALT 15 12/17/2013   AST 22 12/17/2013   NA 142 08/31/2013   K 4.5 08/31/2013   CL 107 08/31/2013   CREATININE 0.8 12/17/2013   BUN 14 12/17/2013   CO2 29 08/31/2013   TSH 3.17 08/31/2013   HGBA1C 5.6 12/17/2013    No results found.     Assessment & Plan:   AWV/CPX/z00.00 - Today patient counseled on age appropriate routine health concerns for screening and prevention, each reviewed and up to date or declined. Immunizations reviewed and up to date or declined. Labs ordered and reviewed. Risk factors for depression reviewed and negative. Hearing function and visual acuity are intact. ADLs screened and addressed as needed. Functional ability and level of safety reviewed and appropriate. Education, counseling and referrals performed based on assessed risks today. Patient provided with a copy of personalized plan for preventive services.  Printed rx for Zostavax given to pt today  L TMJ sx - recommended follow up with dentist for night bite gaurd  Problem List Items Addressed This Visit    Osteopenia   Relevant Orders   Vit D  25 hydroxy (rtn osteoporosis monitoring)    Other Visit Diagnoses    Routine general medical examination at a health care facility    -  Primary    Relevant Orders     Basic metabolic panel    CBC with Differential/Platelet    Hepatic function panel    Lipid panel    TSH    Urinalysis, Routine w reflex microscopic (not at [facility])        [facility], MD

## 2015-12-14 ENCOUNTER — Ambulatory Visit (INDEPENDENT_AMBULATORY_CARE_PROVIDER_SITE_OTHER)
Admission: RE | Admit: 2015-12-14 | Discharge: 2015-12-14 | Disposition: A | Discharge status: Home / Self Care | Payer: Medicare Other | Source: Ambulatory Visit | Attending: Internal Medicine | Admitting: Internal Medicine

## 2015-12-14 ENCOUNTER — Ambulatory Visit (INDEPENDENT_AMBULATORY_CARE_PROVIDER_SITE_OTHER): Payer: Medicare Other | Admitting: Internal Medicine

## 2015-12-14 ENCOUNTER — Encounter: Payer: Self-pay | Admitting: Internal Medicine

## 2015-12-14 ENCOUNTER — Other Ambulatory Visit: Payer: Self-pay | Admitting: Internal Medicine

## 2015-12-14 ENCOUNTER — Other Ambulatory Visit (INDEPENDENT_AMBULATORY_CARE_PROVIDER_SITE_OTHER): Payer: Medicare Other

## 2015-12-14 VITALS — BP 128/80 | HR 93 | Temp 98.5°F | Wt 165.0 lb

## 2015-12-14 DIAGNOSIS — M79642 Pain in left hand: Secondary | ICD-10-CM | POA: Diagnosis not present

## 2015-12-14 DIAGNOSIS — M79641 Pain in right hand: Secondary | ICD-10-CM

## 2015-12-14 DIAGNOSIS — M2550 Pain in unspecified joint: Secondary | ICD-10-CM

## 2015-12-14 DIAGNOSIS — M069 Rheumatoid arthritis, unspecified: Secondary | ICD-10-CM | POA: Insufficient documentation

## 2015-12-14 LAB — BASIC METABOLIC PANEL
BUN: 15 mg/dL (ref 6–23)
CO2: 26 mEq/L (ref 19–32)
Calcium: 9.4 mg/dL (ref 8.4–10.5)
Chloride: 106 mEq/L (ref 96–112)
Creatinine, Ser: 0.7 mg/dL (ref 0.40–1.20)
GFR: 105.63 mL/min (ref >60.00)
Glucose, Bld: 109 mg/dL — ABNORMAL HIGH (ref 70–99)
Potassium: 4.2 mEq/L (ref 3.5–5.1)
Sodium: 140 mEq/L (ref 135–145)

## 2015-12-14 LAB — HEPATIC FUNCTION PANEL
ALT: 12 U/L (ref 0–35)
AST: 15 U/L (ref 0–37)
Albumin: 4.1 g/dL (ref 3.5–5.2)
Alkaline Phosphatase: 92 U/L (ref 39–117)
Bilirubin, Direct: 0.1 mg/dL (ref 0.0–0.3)
Total Bilirubin: 0.3 mg/dL (ref 0.2–1.2)
Total Protein: 7.7 g/dL (ref 6.0–8.3)

## 2015-12-14 LAB — RHEUMATOID FACTOR: Rhuematoid fact SerPl-aCnc: <10 IU/mL (ref <=14)

## 2015-12-14 LAB — CBC WITH DIFFERENTIAL/PLATELET
Basophils Absolute: 0 10*3/uL (ref 0.0–0.1)
Basophils Relative: 0.3 % (ref 0.0–3.0)
Eosinophils Absolute: 0.2 10*3/uL (ref 0.0–0.7)
Eosinophils Relative: 1.7 % (ref 0.0–5.0)
HCT: 45.2 % (ref 36.0–46.0)
Hemoglobin: 15.3 g/dL — ABNORMAL HIGH (ref 12.0–15.0)
Lymphocytes Relative: 19 % (ref 12.0–46.0)
Lymphs Abs: 1.8 10*3/uL (ref 0.7–4.0)
MCHC: 33.9 g/dL (ref 30.0–36.0)
MCV: 89.9 fl (ref 78.0–100.0)
Monocytes Absolute: 0.6 10*3/uL (ref 0.1–1.0)
Monocytes Relative: 6.4 % (ref 3.0–12.0)
Neutro Abs: 6.8 10*3/uL (ref 1.4–7.7)
Neutrophils Relative %: 72.6 % (ref 43.0–77.0)
Platelets: 281 10*3/uL (ref 150.0–400.0)
RBC: 5.03 Mil/uL (ref 3.87–5.11)
RDW: 14 % (ref 11.5–15.5)
WBC: 9.3 10*3/uL (ref 4.0–10.5)

## 2015-12-14 LAB — URINALYSIS
Bilirubin Urine: NEGATIVE
Ketones, ur: NEGATIVE
Leukocytes, UA: NEGATIVE
Nitrite: NEGATIVE
Specific Gravity, Urine: 1.01 (ref 1.000–1.030)
Total Protein, Urine: NEGATIVE
Urine Glucose: NEGATIVE
Urobilinogen, UA: 0.2 (ref 0.0–1.0)
pH: 6.5 (ref 5.0–8.0)

## 2015-12-14 LAB — SEDIMENTATION RATE: Sed Rate: 30 mm/hr — ABNORMAL HIGH (ref 0–22)

## 2015-12-14 LAB — CK: Total CK: 73 U/L (ref 7–177)

## 2015-12-14 MED ORDER — MELOXICAM 15 MG PO TABS: 15.0000 mg | ORAL_TABLET | Freq: Every day | ORAL | Status: DC

## 2015-12-14 NOTE — Assessment & Plan Note (Addendum)
2/17 R>L ?RA vs other, i.e. PMR Meloxicam Labs X ray Rhem ref

## 2015-12-14 NOTE — Progress Notes (Signed)
Subjective:  Patient ID: Linda Soto, female    DOB: 06-Apr-1941  Age: 75 y.o. MRN: [id number]  CC: Shoulder Pain and Wrist Pain   HPI MARVIN MAENZA presents for severe pain and stiffness in hands and shoulders; R>L since December 2016 - worse. C/o stiffness in am. No new meds     Outpatient Prescriptions Prior to Visit  Medication Sig Dispense Refill  . Calcium Carbonate-Vitamin D (CALCIUM-VITAMIN D) 500-200 MG-UNIT tablet Take 1 tablet by mouth daily.    . Multiple Vitamins-Minerals (CENTRUM ADULTS PO) Take by mouth.     No facility-administered medications prior to visit.    ROS Review of Systems  Constitutional: Negative for fever, chills, activity change, appetite change, fatigue and unexpected weight change.  HENT: Negative for congestion, dental problem, mouth sores, nosebleeds, sinus pressure and trouble swallowing.   Eyes: Negative for visual disturbance.  Respiratory: Negative for cough, chest tightness and wheezing.   Gastrointestinal: Negative for nausea, abdominal pain, diarrhea and blood in stool.  Genitourinary: Negative for frequency, difficulty urinating, genital sores and vaginal pain.  Musculoskeletal: Positive for joint swelling, arthralgias, neck pain and neck stiffness. Negative for myalgias, back pain and gait problem.  Skin: Negative for pallor and rash.  Neurological: Negative for dizziness, tremors, facial asymmetry, weakness, numbness and headaches.  Psychiatric/Behavioral: Negative for suicidal ideas, confusion and sleep disturbance. The patient is not nervous/anxious.   R MCP with swelling - painful hands, wrists  Objective:  BP 128/80 mmHg  Pulse 93  Temp(Src) 98.5 F (36.9 C) (Oral)  Wt 165 lb (74.844 kg)  SpO2 95%  BP Readings from Last 3 Encounters:  12/14/15 128/80  09/04/15 120/68  08/19/13 122/72    Wt Readings from Last 3 Encounters:  12/14/15 165 lb (74.844 kg)  09/04/15 163 lb 4 oz (74.05 kg)  08/19/13 160 lb (72.576 kg)      Physical Exam  Constitutional: She appears well-developed. No distress.  HENT:  Head: Normocephalic.  Right Ear: External ear normal.  Left Ear: External ear normal.  Nose: Nose normal.  Mouth/Throat: Oropharynx is clear and moist.  Eyes: Conjunctivae are normal. Pupils are equal, round, and reactive to light. Right eye exhibits no discharge. Left eye exhibits no discharge.  Neck: Normal range of motion. Neck supple. No JVD present. No tracheal deviation present. No thyromegaly present.  Cardiovascular: Normal rate, regular rhythm and normal heart sounds.   Pulmonary/Chest: No stridor. No respiratory distress. She has no wheezes.  Abdominal: Soft. Bowel sounds are normal. She exhibits no distension and no mass. There is no tenderness. There is no rebound and no guarding.  Musculoskeletal: She exhibits edema and tenderness.  Lymphadenopathy:    She has no cervical adenopathy.  Neurological: She displays normal reflexes. No cranial nerve deficit. She exhibits normal muscle tone. Coordination normal.  Skin: No rash noted. No erythema. No pallor.  Psychiatric: She has a normal mood and affect. Her behavior is normal. Judgment and thought content normal.  R MCP with swelling - painful hands, wrists, B shoulders w/ROM  Lab Results  Component Value Date   WBC 9.3 12/14/2015   HGB 15.3* 12/14/2015   HCT 45.2 12/14/2015   PLT 281.0 12/14/2015   GLUCOSE 109* 12/14/2015   CHOL 171 09/04/2015   TRIG 57.0 09/04/2015   HDL 63.80 09/04/2015   LDLCALC 96 09/04/2015   ALT 12 12/14/2015   AST 15 12/14/2015   NA 140 12/14/2015   K 4.2 12/14/2015   CL 106 12/14/2015  CREATININE 0.70 12/14/2015   BUN 15 12/14/2015   CO2 26 12/14/2015   TSH 2.47 09/04/2015   HGBA1C 5.6 12/17/2013    No results found.  Assessment & Plan:   Gage was seen today for shoulder pain and wrist pain.  Diagnoses and all orders for this visit:  Bilateral hand pain -     DG Hand Complete Right; Future -      Basic metabolic panel; Future -     CBC with Differential/Platelet; Future -     Hepatic function panel; Future -     ANA; Future -     Sedimentation rate; Future -     CK; Future -     Rheumatoid factor; Future -     Urinalysis; Future -     Ambulatory referral to Rheumatology  Arthralgia -     Basic metabolic panel; Future -     CBC with Differential/Platelet; Future -     Hepatic function panel; Future -     ANA; Future -     Sedimentation rate; Future -     CK; Future -     Rheumatoid factor; Future -     Urinalysis; Future -     Ambulatory referral to Rheumatology  Other orders -     meloxicam (MOBIC) 15 MG tablet; Take 1 tablet (15 mg total) by mouth daily.   I am having Ms. Schoneman start on meloxicam. I am also having her maintain her Multiple Vitamins-Minerals (CENTRUM ADULTS PO) and calcium-vitamin D.  Meds ordered this encounter  Medications  . meloxicam (MOBIC) 15 MG tablet    Sig: Take 1 tablet (15 mg total) by mouth daily.    Dispense:  30 tablet    Refill:  5     Follow-up: Return in about 4 weeks (around 01/11/2016) for a follow-up visit.  Sonda Primes, MD

## 2015-12-14 NOTE — Progress Notes (Signed)
Pre visit review using our clinic review tool, if applicable. No additional management support is needed unless otherwise documented below in the visit note. 

## 2015-12-14 NOTE — Assessment & Plan Note (Signed)
2/17 R>L ?RA vs other Meloxicam Labs X ray

## 2015-12-15 LAB — ANA: Anti Nuclear Antibody(ANA): NEGATIVE

## 2016-05-24 IMAGING — DX DG HAND COMPLETE 3+V*R*
3 series · 3 of 3 positions shown · non-contrast
Comparison: None.

CLINICAL DATA: Bilateral hand pain without reported injury.

EXAM:
RIGHT HAND - COMPLETE 3+ VIEW

[hand ap]
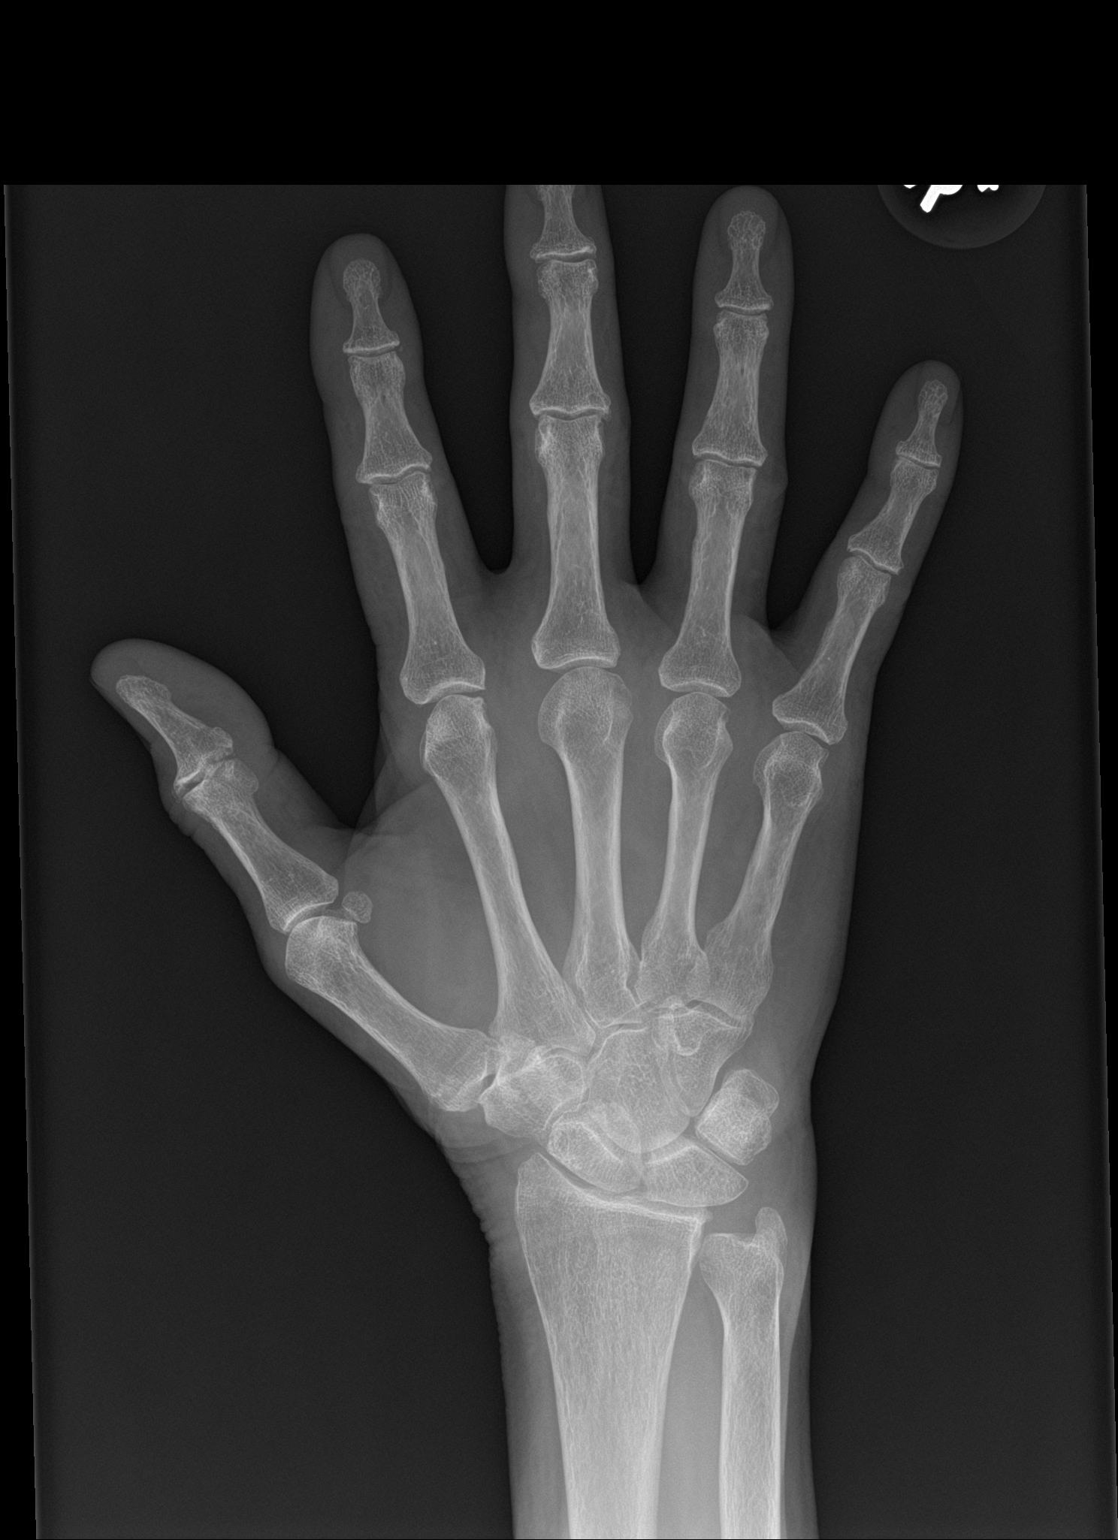

[hand obl]
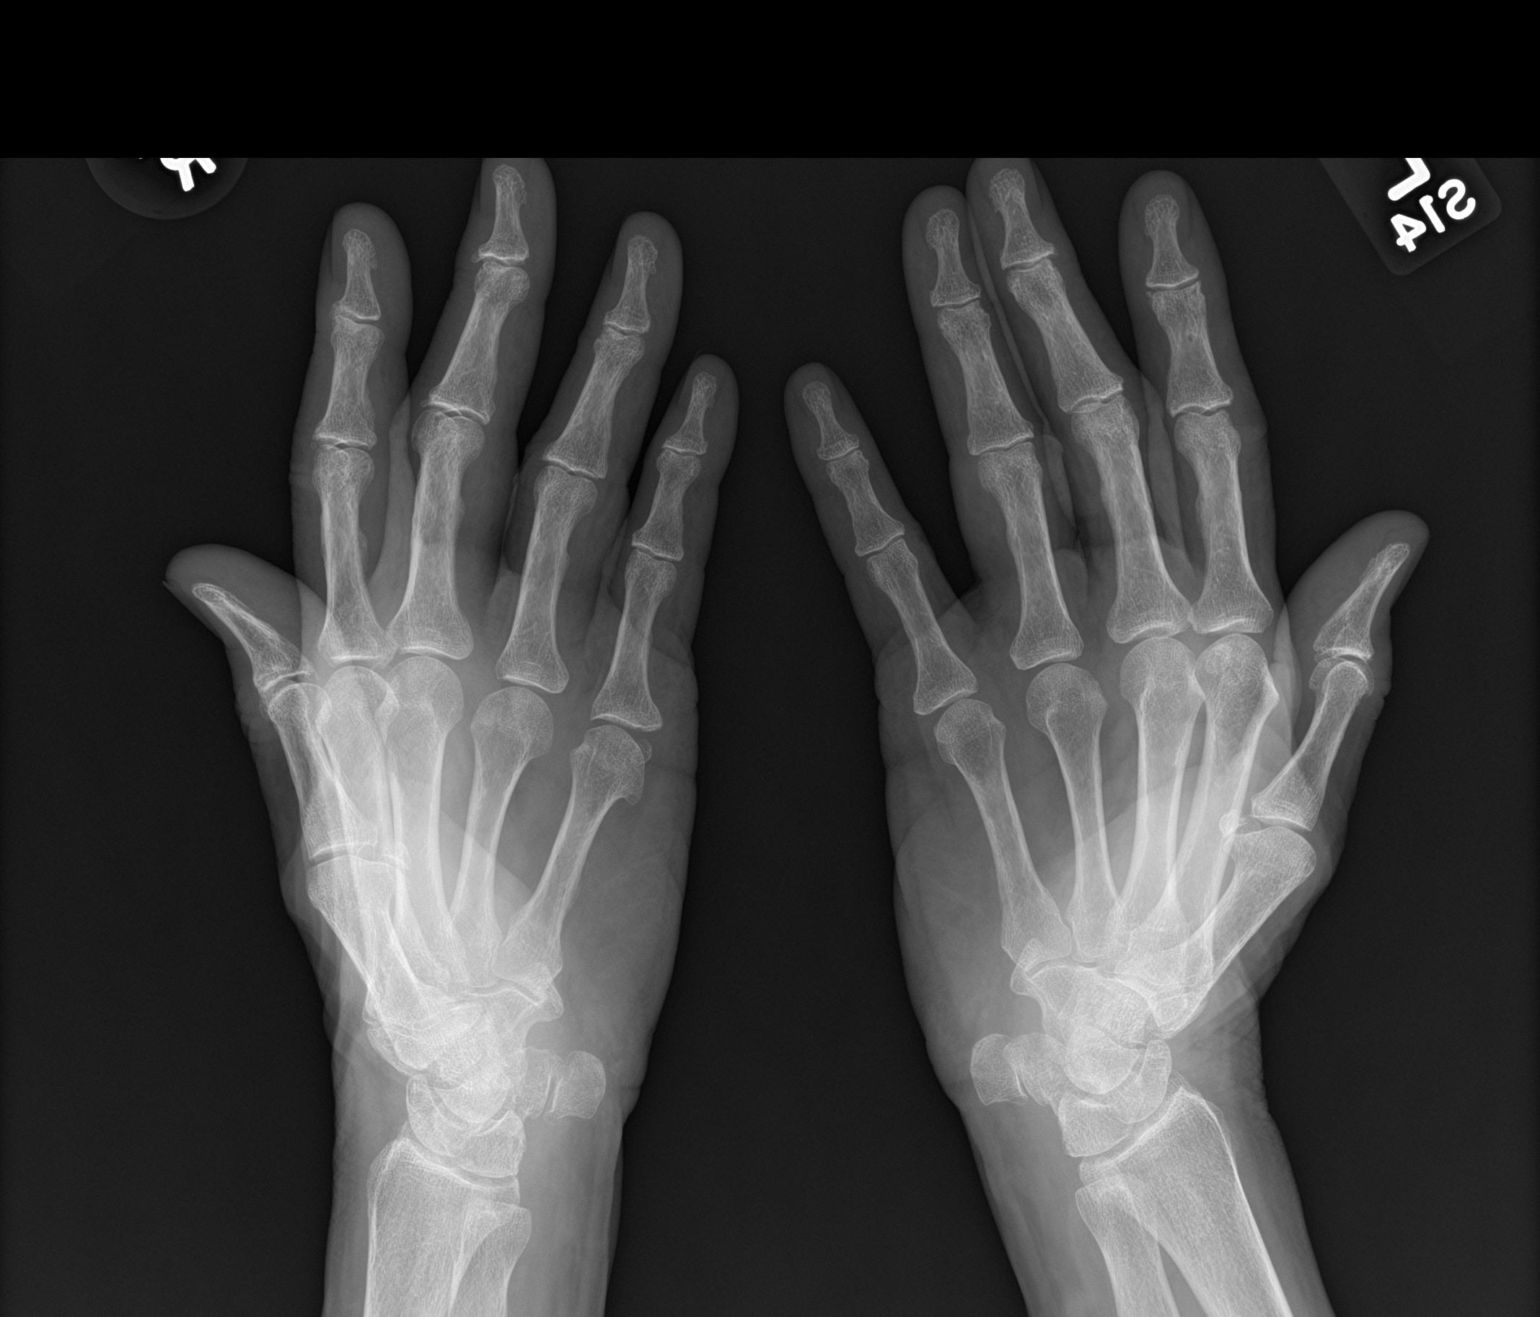

[hand lat]
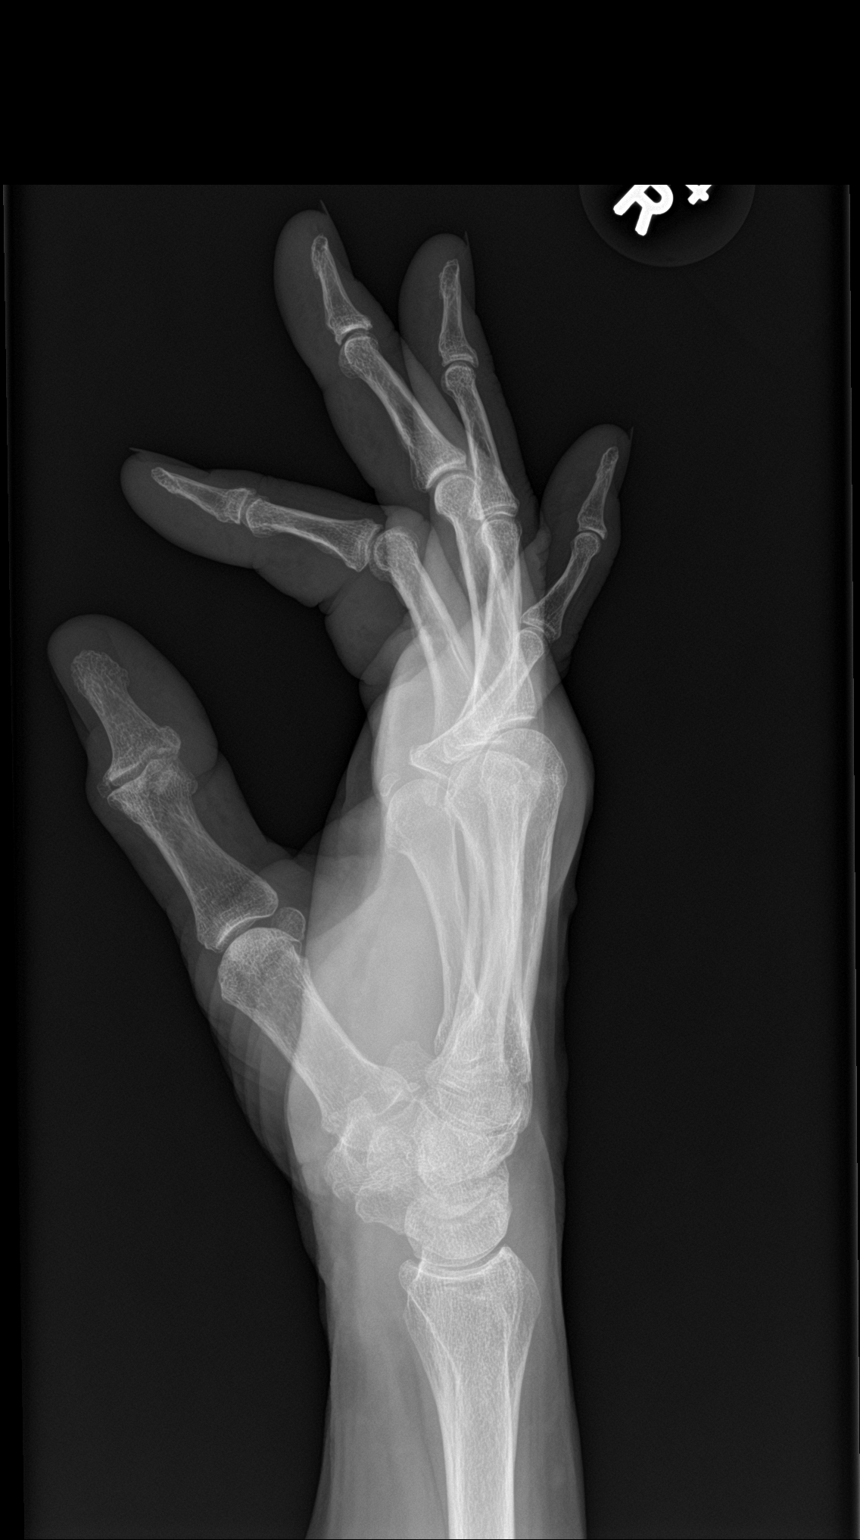

[3 of 3 positions shown; findings below may reference images not displayed]

FINDINGS: There is no evidence of fracture or dislocation. There is no
evidence of arthropathy or other focal bone abnormality. Soft
tissues are unremarkable.
IMPRESSION: Normal right hand.

## 2016-09-06 ENCOUNTER — Other Ambulatory Visit: Payer: Self-pay | Admitting: *Deleted

## 2016-09-06 ENCOUNTER — Other Ambulatory Visit (INDEPENDENT_AMBULATORY_CARE_PROVIDER_SITE_OTHER): Payer: Medicare Other

## 2016-09-06 ENCOUNTER — Encounter: Payer: Self-pay | Admitting: Nurse Practitioner

## 2016-09-06 ENCOUNTER — Ambulatory Visit (INDEPENDENT_AMBULATORY_CARE_PROVIDER_SITE_OTHER): Payer: Medicare Other | Admitting: Nurse Practitioner

## 2016-09-06 VITALS — BP 128/92 | HR 86 | Temp 98.3°F | Ht 67.0 in | Wt 169.0 lb

## 2016-09-06 DIAGNOSIS — Z0000 Encounter for general adult medical examination without abnormal findings: Secondary | ICD-10-CM | POA: Diagnosis not present

## 2016-09-06 LAB — LIPID PANEL
Cholesterol: 169 mg/dL (ref 0–200)
HDL: 64.4 mg/dL
LDL Cholesterol: 81 mg/dL (ref 0–99)
NonHDL: 105.02
Total CHOL/HDL Ratio: 3
Triglycerides: 119 mg/dL (ref 0.0–149.0)
VLDL: 23.8 mg/dL (ref 0.0–40.0)

## 2016-09-06 NOTE — Progress Notes (Signed)
Subjective:    Patient ID: Linda Soto, female    DOB: 1941-11-11, 75 y.o.   MRN: [id number]  Patient presents today for complete physical  HPI   she denies any acute complaints today.  Immunizations: (TDAP, Hep C screen, Pneumovax, Influenza, zoster)  Health Maintenance  Topic Date Due  . Flu Shot  06/11/2016  . Colon Cancer Screening  11/11/2016  . Mammogram  07/12/2017  . Tetanus Vaccine  09/02/2023  . DEXA scan (bone density measurement)  Completed  . Shingles Vaccine  Completed  . Pneumonia vaccines  Completed   Diet:healthy Weight:  Wt Readings from Last 3 Encounters:  09/06/16 169 lb (76.7 kg)  12/14/15 165 lb (74.8 kg)  09/04/15 163 lb 4 oz (74 kg)    Exercise:walking Fall Risk: Fall Risk  09/04/2015 08/19/2013  Falls in the past year? No No   Depression/Suicide: Depression screen [facility] 2/9 09/04/2015 08/19/2013  Decreased Interest 0 0  Down, Depressed, Hopeless 0 0  PHQ - 2 Score 0 0   No flowsheet data found. Colonoscopy (every 5-30yrs, >50-47yrs):up to date Dexa (every 2-26yrs, >4yrs):up to date, last done 2016, records requested Mammogram (yearly, >3yrs):up to date Advanced Directive: No flowsheet data found.  Medications and allergies reviewed with patient and updated if appropriate.  Patient Active Problem List   Diagnosis Date Noted  . Bilateral hand pain 12/14/2015  . Arthralgia 12/14/2015  . Osteopenia   . Foot lesion 03/30/2012  . Dense breasts 03/30/2012    Current Outpatient Prescriptions on File Prior to Visit  Medication Sig Dispense Refill  . Calcium Carbonate-Vitamin D (CALCIUM-VITAMIN D) 500-200 MG-UNIT tablet Take 1 tablet by mouth daily.    . Multiple Vitamins-Minerals (CENTRUM ADULTS PO) Take by mouth.     No current facility-administered medications on file prior to visit.     Past Medical History:  Diagnosis Date  . Allergic rhinitis   . Dense breasts    f/u mammo rec 2013 because of same  . Diverticular disease of  left colon   . Menopausal symptom 1997  . Osteopenia    at femur, follows DEXA with gyn q 54yrs    Past Surgical History:  Procedure Laterality Date  . NO PAST SURGERIES      Social History   Social History  . Marital status: Divorced    Spouse name: N/A  . Number of children: N/A  . Years of education: N/A   Social History Main Topics  . Smoking status: Never Smoker  . Smokeless tobacco: Never Used  . Alcohol use No  . Drug use: No  . Sexual activity: No   Other Topics Concern  . None   Social History Narrative   divorced, lives alone - retired from [employer] in 2011    Family History  Problem Relation Age of Onset  . Prostate cancer Father 58  . Arthritis Mother   . Osteoporosis Mother     vertebral compression fx  . Myasthenia gravis Mother 22  . Lung cancer Maternal Aunt   . Breast cancer Maternal Aunt   . Uterine cancer Maternal Aunt         Review of Systems  Constitutional: Negative for fever, malaise/fatigue and weight loss.  HENT: Negative for congestion and sore throat.   Eyes:       Negative for visual changes  Respiratory: Negative for cough and shortness of breath.   Cardiovascular: Negative for chest pain, palpitations and leg swelling.  Gastrointestinal:  Negative for blood in stool, constipation, diarrhea and heartburn.  Genitourinary: Negative for dysuria, frequency and urgency.  Musculoskeletal: Negative for falls, joint pain and myalgias.  Skin: Negative for rash.  Neurological: Negative for dizziness, sensory change and headaches.  Endo/Heme/Allergies: Does not bruise/bleed easily.  Psychiatric/Behavioral: Negative for depression, substance abuse and suicidal ideas. The patient is not nervous/anxious.     Objective:   Vitals:   09/06/16 1405  BP: (!) 128/92  Pulse: 86  Temp: 98.3 F (36.8 C)    Body mass index is 26.47 kg/m.   Physical Examination:  Physical Exam  Constitutional: She is oriented to person,  place, and time and well-developed, well-nourished, and in no distress. No distress.  HENT:  Right Ear: External ear normal.  Left Ear: External ear normal.  Nose: Nose normal.  Mouth/Throat: Oropharynx is clear and moist. No oropharyngeal exudate.  Eyes: Conjunctivae and EOM are normal. Pupils are equal, round, and reactive to light. No scleral icterus.  Neck: Normal range of motion. Neck supple. No thyromegaly present.  Cardiovascular: Normal rate, normal heart sounds and intact distal pulses.   Pulmonary/Chest: Effort normal and breath sounds normal. She exhibits no tenderness and no bony tenderness. Right breast exhibits no inverted nipple, no nipple discharge, no skin change and no tenderness. Left breast exhibits no inverted nipple, no nipple discharge, no skin change and no tenderness. Breasts are symmetrical.  Bilateral dense breast tissue  Abdominal: Soft. Bowel sounds are normal. She exhibits no distension. There is no tenderness.  Musculoskeletal: Normal range of motion. She exhibits no edema or tenderness.  Lymphadenopathy:    She has no cervical adenopathy.  Neurological: She is alert and oriented to person, place, and time. Gait normal.  Skin: Skin is warm and dry.  Psychiatric: Affect and judgment normal.    ASSESSMENT and PLAN:  [name] was seen today for annual exam.  Diagnoses and all orders for this visit:  Encounter for preventive health examination -     Lipid panel; Future -     Cancel: DG Bone Density; Future   No problem-specific Assessment & Plan notes found for this encounter.    Lipid Panel     Component Value Date/Time   CHOL 169 09/06/2016 1511   TRIG 119.0 09/06/2016 1511   HDL 64.40 09/06/2016 1511   CHOLHDL 3 09/06/2016 1511   VLDL 23.8 09/06/2016 1511   LDLCALC 81 09/06/2016 1511   Follow up: Return in about 1 year (around 09/06/2017), or cpe.  Linda Penna, NP

## 2016-09-06 NOTE — Progress Notes (Signed)
Pre visit review using our clinic review tool, if applicable. No additional management support is needed unless otherwise documented below in the visit note. 

## 2016-09-06 NOTE — Patient Instructions (Signed)
Health Maintenance, Female Adopting a healthy lifestyle and getting preventive care can go a long way to promote health and wellness. Talk with your health care provider about what schedule of regular examinations is right for you. This is a good chance for you to check in with your provider about disease prevention and staying healthy. In between checkups, there are plenty of things you can do on your own. Experts have done a lot of research about which lifestyle changes and preventive measures are most likely to keep you healthy. Ask your health care provider for more information. WEIGHT AND DIET  Eat a healthy diet  Be sure to include plenty of vegetables, fruits, low-fat dairy products, and lean protein.  Do not eat a lot of foods high in solid fats, added sugars, or salt.  Get regular exercise. This is one of the most important things you can do for your health.  Most adults should exercise for at least 150 minutes each week. The exercise should increase your heart rate and make you sweat (moderate-intensity exercise).  Most adults should also do strengthening exercises at least twice a week. This is in addition to the moderate-intensity exercise.  Maintain a healthy weight  Body mass index (BMI) is a measurement that can be used to identify possible weight problems. It estimates body fat based on height and weight. Your health care provider can help determine your BMI and help you achieve or maintain a healthy weight.  For females 20 years of age and older:   A BMI below 18.5 is considered underweight.  A BMI of 18.5 to 24.9 is normal.  A BMI of 25 to 29.9 is considered overweight.  A BMI of 30 and above is considered obese.  Watch levels of cholesterol and blood lipids  You should start having your blood tested for lipids and cholesterol at 73 years of age, then have this test every 5 years.  You may need to have your cholesterol levels checked more often if:  Your lipid  or cholesterol levels are high.  You are older than 73 years of age.  You are at high risk for heart disease.  CANCER SCREENING   Lung Cancer  Lung cancer screening is recommended for adults 55-80 years old who are at high risk for lung cancer because of a history of smoking.  A yearly low-dose CT scan of the lungs is recommended for people who:  Currently smoke.  Have quit within the past 15 years.  Have at least a 30-pack-year history of smoking. A pack year is smoking an average of one pack of cigarettes a day for 1 year.  Yearly screening should continue until it has been 15 years since you quit.  Yearly screening should stop if you develop a health problem that would prevent you from having lung cancer treatment.  Breast Cancer  Practice breast self-awareness. This means understanding how your breasts normally appear and feel.  It also means doing regular breast self-exams. Let your health care provider know about any changes, no matter how small.  If you are in your 20s or 30s, you should have a clinical breast exam (CBE) by a health care provider every 1-3 years as part of a regular health exam.  If you are 40 or older, have a CBE every year. Also consider having a breast X-ray (mammogram) every year.  If you have a family history of breast cancer, talk to your health care provider about genetic screening.  If you   are at high risk for breast cancer, talk to your health care provider about having an MRI and a mammogram every year.  Breast cancer gene (BRCA) assessment is recommended for women who have family members with BRCA-related cancers. BRCA-related cancers include:  Breast.  Ovarian.  Tubal.  Peritoneal cancers.  Results of the assessment will determine the need for genetic counseling and BRCA1 and BRCA2 testing. Cervical Cancer Your health care provider may recommend that you be screened regularly for cancer of the pelvic organs (ovaries, uterus, and  vagina). This screening involves a pelvic examination, including checking for microscopic changes to the surface of your cervix (Pap test). You may be encouraged to have this screening done every 3 years, beginning at age 21.  For women ages 30-65, health care providers may recommend pelvic exams and Pap testing every 3 years, or they may recommend the Pap and pelvic exam, combined with testing for human papilloma virus (HPV), every 5 years. Some types of HPV increase your risk of cervical cancer. Testing for HPV may also be done on women of any age with unclear Pap test results.  Other health care providers may not recommend any screening for nonpregnant women who are considered low risk for pelvic cancer and who do not have symptoms. Ask your health care provider if a screening pelvic exam is right for you.  If you have had past treatment for cervical cancer or a condition that could lead to cancer, you need Pap tests and screening for cancer for at least 20 years after your treatment. If Pap tests have been discontinued, your risk factors (such as having a new sexual partner) need to be reassessed to determine if screening should resume. Some women have medical problems that increase the chance of getting cervical cancer. In these cases, your health care provider may recommend more frequent screening and Pap tests. Colorectal Cancer  This type of cancer can be detected and often prevented.  Routine colorectal cancer screening usually begins at 73 years of age and continues through 73 years of age.  Your health care provider may recommend screening at an earlier age if you have risk factors for colon cancer.  Your health care provider may also recommend using home test kits to check for hidden blood in the stool.  A small camera at the end of a tube can be used to examine your colon directly (sigmoidoscopy or colonoscopy). This is done to check for the earliest forms of colorectal  cancer.  Routine screening usually begins at age 50.  Direct examination of the colon should be repeated every 5-10 years through 73 years of age. However, you may need to be screened more often if early forms of precancerous polyps or small growths are found. Skin Cancer  Check your skin from head to toe regularly.  Tell your health care provider about any new moles or changes in moles, especially if there is a change in a mole's shape or color.  Also tell your health care provider if you have a mole that is larger than the size of a pencil eraser.  Always use sunscreen. Apply sunscreen liberally and repeatedly throughout the day.  Protect yourself by wearing long sleeves, pants, a wide-brimmed hat, and sunglasses whenever you are outside. HEART DISEASE, DIABETES, AND HIGH BLOOD PRESSURE   High blood pressure causes heart disease and increases the risk of stroke. High blood pressure is more likely to develop in:  People who have blood pressure in the high end   of the normal range (130-139/85-89 mm Hg).  People who are overweight or obese.  People who are African American.  If you are 38-23 years of age, have your blood pressure checked every 3-5 years. If you are 61 years of age or older, have your blood pressure checked every year. You should have your blood pressure measured twice--once when you are at a hospital or clinic, and once when you are not at a hospital or clinic. Record the average of the two measurements. To check your blood pressure when you are not at a hospital or clinic, you can use:  An automated blood pressure machine at a pharmacy.  A home blood pressure monitor.  If you are between 45 years and 39 years old, ask your health care provider if you should take aspirin to prevent strokes.  Have regular diabetes screenings. This involves taking a blood sample to check your fasting blood sugar level.  If you are at a normal weight and have a low risk for diabetes,  have this test once every three years after 73 years of age.  If you are overweight and have a high risk for diabetes, consider being tested at a younger age or more often. PREVENTING INFECTION  Hepatitis B  If you have a higher risk for hepatitis B, you should be screened for this virus. You are considered at high risk for hepatitis B if:  You were born in a country where hepatitis B is common. Ask your health care provider which countries are considered high risk.  Your parents were born in a high-risk country, and you have not been immunized against hepatitis B (hepatitis B vaccine).  You have HIV or AIDS.  You use needles to inject street drugs.  You live with someone who has hepatitis B.  You have had sex with someone who has hepatitis B.  You get hemodialysis treatment.  You take certain medicines for conditions, including cancer, organ transplantation, and autoimmune conditions. Hepatitis C  Blood testing is recommended for:  Everyone born from [birth year] through 1963.  Anyone with known risk factors for hepatitis C. Sexually transmitted infections (STIs)  You should be screened for sexually transmitted infections (STIs) including gonorrhea and chlamydia if:  You are sexually active and are younger than 73 years of age.  You are older than 73 years of age and your health care provider tells you that you are at risk for this type of infection.  Your sexual activity has changed since you were last screened and you are at an increased risk for chlamydia or gonorrhea. Ask your health care provider if you are at risk.  If you do not have HIV, but are at risk, it may be recommended that you take a prescription medicine daily to prevent HIV infection. This is called pre-exposure prophylaxis (PrEP). You are considered at risk if:  You are sexually active and do not regularly use condoms or know the HIV status of your partner(s).  You take drugs by injection.  You are sexually  active with a partner who has HIV. Talk with your health care provider about whether you are at high risk of being infected with HIV. If you choose to begin PrEP, you should first be tested for HIV. You should then be tested every 3 months for as long as you are taking PrEP.  PREGNANCY   If you are premenopausal and you may become pregnant, ask your health care provider about preconception counseling.  If you may  become pregnant, take 400 to 800 micrograms (mcg) of folic acid every day.  If you want to prevent pregnancy, talk to your health care provider about birth control (contraception). OSTEOPOROSIS AND MENOPAUSE   Osteoporosis is a disease in which the bones lose minerals and strength with aging. This can result in serious bone fractures. Your risk for osteoporosis can be identified using a bone density scan.  If you are 61 years of age or older, or if you are at risk for osteoporosis and fractures, ask your health care provider if you should be screened.  Ask your health care provider whether you should take a calcium or vitamin D supplement to lower your risk for osteoporosis.  Menopause may have certain physical symptoms and risks.  Hormone replacement therapy may reduce some of these symptoms and risks. Talk to your health care provider about whether hormone replacement therapy is right for you.  HOME CARE INSTRUCTIONS   Schedule regular health, dental, and eye exams.  Stay current with your immunizations.   Do not use any tobacco products including cigarettes, chewing tobacco, or electronic cigarettes.  If you are pregnant, do not drink alcohol.  If you are breastfeeding, limit how much and how often you drink alcohol.  Limit alcohol intake to no more than 1 drink per day for nonpregnant women. One drink equals 12 ounces of beer, 5 ounces of wine, or 1 ounces of hard liquor.  Do not use street drugs.  Do not share needles.  Ask your health care provider for help if  you need support or information about quitting drugs.  Tell your health care provider if you often feel depressed.  Tell your health care provider if you have ever been abused or do not feel safe at home.   This information is not intended to replace advice given to you by your health care provider. Make sure you discuss any questions you have with your health care provider.   Document Released: 05/13/2011 Document Revised: 11/18/2014 Document Reviewed: 09/29/2013 Elsevier Interactive Patient Education [employer].

## 2016-09-06 NOTE — Progress Notes (Signed)
Normal results, see office note

## 2016-10-07 ENCOUNTER — Encounter: Payer: Self-pay | Admitting: Nurse Practitioner

## 2017-02-11 ENCOUNTER — Telehealth: Payer: Self-pay | Admitting: Nurse Practitioner

## 2017-02-11 DIAGNOSIS — M2550 Pain in unspecified joint: Secondary | ICD-10-CM

## 2017-02-11 DIAGNOSIS — M79641 Pain in right hand: Secondary | ICD-10-CM

## 2017-02-11 DIAGNOSIS — M79642 Pain in left hand: Principal | ICD-10-CM

## 2017-02-11 NOTE — Telephone Encounter (Signed)
Pt called in and would like a referral to:  Address: [address], [place], [place] [address]  Hours:                         Phone: [phone]

## 2017-02-11 NOTE — Telephone Encounter (Signed)
Pt used to see Dr. Kellie Simmering for Rheumatology but he is retiring so she request new referral to go to [facility], she already has an appt there but her insurance require her to have have referral from PCP. Please advise.

## 2017-02-12 NOTE — Telephone Encounter (Signed)
Ok to enter referral 

## 2017-02-12 NOTE — Telephone Encounter (Signed)
Patient has called back in regard. I have informed patient that referral has been entered and that it is in the process or being worked.  Patient is concerned about records being sent from her other rheumatologist.  I have told her that we would send anything recent in regard to the referral.

## 2017-02-12 NOTE — Telephone Encounter (Signed)
Referral enter. [facility] please help

## 2017-02-12 NOTE — Telephone Encounter (Signed)
Pt called to speak with you. She said that she was returning your call. Please call her on her cell [phone]).

## 2017-02-13 NOTE — Telephone Encounter (Signed)
Referral placed and pt informed

## 2017-02-18 LAB — HEPATIC FUNCTION PANEL
ALT: 15 U/L (ref 7–35)
AST: 19 U/L (ref 13–35)
Alkaline Phosphatase: 64 U/L (ref 25–125)
Bilirubin, Total: 0.4 mg/dL

## 2017-02-18 LAB — CBC AND DIFFERENTIAL
HCT: 41 % (ref 36–46)
Hemoglobin: 13.9 g/dL (ref 12.0–16.0)
Platelets: 261 10*3/uL (ref 150–399)
WBC: 5.5 10^3/mL

## 2017-02-18 LAB — BASIC METABOLIC PANEL
BUN: 11 mg/dL (ref 4–21)
BUN: 11 mg/dL (ref 4–21)
Creatinine: 0.8 mg/dL (ref 0.5–1.1)
Glucose: 78 mg/dL
Glucose: 78 mg/dL
Potassium: 4.2 mmol/L (ref 3.4–5.3)
Sodium: 141 mmol/L (ref 137–147)

## 2017-02-20 ENCOUNTER — Encounter: Payer: Self-pay | Admitting: Nurse Practitioner

## 2017-02-20 NOTE — Progress Notes (Signed)
Abstracted result and sent to scan  

## 2017-02-25 ENCOUNTER — Encounter: Payer: Self-pay | Admitting: Nurse Practitioner

## 2017-02-25 NOTE — Progress Notes (Unsigned)
Abstracted result and sent to scan  

## 2017-04-10 ENCOUNTER — Encounter: Payer: Self-pay | Admitting: Nurse Practitioner

## 2017-09-26 NOTE — Progress Notes (Signed)
Subjective:   Linda Soto is a 76 y.o. female who presents for Medicare Annual (Subsequent) preventive examination.  Review of Systems:  No ROS.  Medicare Wellness Visit. Additional risk factors are reflected in the social history.  Cardiac Risk Factors include: advanced age (>34men, >39 women) Sleep patterns: gets up 1-2 times nightly to void and sleeps 6-7 hours nightly.    Home Safety/Smoke Alarms: Feels safe in home. Smoke alarms in place.  Living environment; residence and Firearm Safety: 1-story house/ trailer, no firearms Lives alone, no needs for DME, good support system Seat Belt Safety/Bike Helmet: Wears seat belt.    Objective:     Vitals: BP 124/80   Pulse 82   Temp 98.3 F (36.8 C)   Resp 20   Ht 5\' 6"  (1.676 m)   Wt 164 lb (74.4 kg)   SpO2 98%   BMI 26.47 kg/m   Body mass index is 26.47 kg/m.   Tobacco Social History   Tobacco Use  Smoking Status Former Smoker  . Types: Cigarettes  Smokeless Tobacco Never Used  Tobacco Comment   quit in 1975     Counseling given: Not Answered Comment: quit in 1975   Past Medical History:  Diagnosis Date  . Allergic rhinitis   . Dense breasts    f/u mammo rec 2013 because of same  . Diverticular disease of left colon   . Menopausal symptom 1997  . Osteopenia    at femur, follows DEXA with gyn q 77yrs   Past Surgical History:  Procedure Laterality Date  . NO PAST SURGERIES     Family History  Problem Relation Age of Onset  . Prostate cancer Father 7  . Arthritis Mother   . Osteoporosis Mother        vertebral compression fx  . Myasthenia gravis Mother 5  . Lung cancer Maternal Aunt   . Breast cancer Maternal Aunt   . Uterine cancer Maternal Aunt    Social History   Substance and Sexual Activity  Sexual Activity No    Outpatient Encounter Medications as of 09/29/2017  Medication Sig  . folic acid (FOLVITE) 1 MG tablet Take 1 mg by mouth daily.  . hydroxychloroquine (PLAQUENIL) 200 MG  tablet Take 200 mg daily by mouth.  . methotrexate (RHEUMATREX) 2.5 MG tablet 2.5 mg.   . Multiple Vitamins-Minerals (CENTRUM ADULTS PO) Take by mouth.  . [DISCONTINUED] Calcium Carbonate-Vitamin D (CALCIUM-VITAMIN D) 500-200 MG-UNIT tablet Take 1 tablet by mouth daily.   No facility-administered encounter medications on file as of 09/29/2017.     Activities of Daily Living In your present state of health, do you have any difficulty performing the following activities: 09/29/2017  Hearing? N  Vision? N  Difficulty concentrating or making decisions? N  Walking or climbing stairs? N  Dressing or bathing? N  Doing errands, shopping? N  Preparing Food and eating ? N  Using the Toilet? N  In the past six months, have you accidently leaked urine? N  Do you have problems with loss of bowel control? N  Managing your Medications? N  Managing your Finances? N  Housekeeping or managing your Housekeeping? N  Some recent data might be hidden    Patient Care Team: Nche, 10/01/2017, NP as PCP - General (Internal Medicine) Bonna Gains Pennie Rushing, MD (Obstetrics and Gynecology) Maris Berger, MD (Gastroenterology) Charna Elizabeth, MD (Ophthalmology)    Assessment:    Physical assessment deferred to PCP.  Exercise Activities and Dietary  recommendations Current Exercise Habits: Structured exercise class;Home exercise routine, Type of exercise: walking;calisthenics(hiking and [employer] classes), Time (Minutes): 55, Frequency (Times/Week): 4, Weekly Exercise (Minutes/Week): 220, Intensity: Moderate  Diet (meal preparation, eat out, water intake, caffeinated beverages, dairy products, fruits and vegetables): in general, a "healthy" diet  , well balanced, eats a variety of fruits and vegetables daily, limits salt, fat/cholesterol, sugar, caffeine, drinks 6-8 glasses of water daily.  Goals    . Stay healthy and active     Continue to exercise, eat healthy, hike, garden and enjoy life      Fall  Risk Fall Risk  09/29/2017 09/04/2015 08/19/2013  Falls in the past year? No No No   Depression Screen PHQ 2/9 Scores 09/29/2017 09/04/2015 08/19/2013  PHQ - 2 Score 0 0 0  PHQ- 9 Score 1 - -     Cognitive Function MMSE - Mini Mental State Exam 09/29/2017  Orientation to time 5  Orientation to Place 5  Registration 3  Attention/ Calculation 5  Recall 3  Language- name 2 objects 2  Language- repeat 1  Language- follow 3 step command 3  Language- read & follow direction 1  Write a sentence 1  Copy design 1  Total score 30        Immunization History  Administered Date(s) Administered  . Influenza, High Dose Seasonal PF 09/01/2013, 09/20/2014  . Influenza,inj,Quad PF,6+ Mos 09/04/2015  . Pneumococcal Conjugate-13 09/04/2015  . Pneumococcal-Unspecified 11/11/2010  . Tetanus 09/01/2013  . Zoster 10/27/2015   Screening Tests Health Maintenance  Topic Date Due  . COLONOSCOPY  11/11/2016  . INFLUENZA VACCINE  06/11/2017  . MAMMOGRAM  07/12/2017  . TETANUS/TDAP  09/02/2023  . DEXA SCAN  Completed  . PNA vac Low Risk Adult  Completed      Plan:    Continue doing brain stimulating activities (puzzles, reading, adult coloring books, staying active) to keep memory sharp.   Continue to eat heart healthy diet (full of fruits, vegetables, whole grains, lean protein, water--limit salt, fat, and sugar intake) and increase physical activity as tolerated.  I have personally reviewed and noted the following in the patient's chart:   . Medical and social history . Use of alcohol, tobacco or illicit drugs  . Current medications and supplements . Functional ability and status . Nutritional status . Physical activity . Advanced directives . List of other physicians . Vitals . Screenings to include cognitive, depression, and falls . Referrals and appointments  In addition, I have reviewed and discussed with patient certain preventive protocols, quality metrics, and best  practice recommendations. A written personalized care plan for preventive services as well as general preventive health recommendations were provided to patient.     Wanda Plump, RN  09/29/2017  Medical screening examination/treatment/procedure(s) were performed by non-physician practitioner and as supervising physician I was immediately available for consultation/collaboration. I agree with above. Oliver Barre, MD

## 2017-09-29 ENCOUNTER — Ambulatory Visit (INDEPENDENT_AMBULATORY_CARE_PROVIDER_SITE_OTHER): Payer: Medicare Other | Admitting: Internal Medicine

## 2017-09-29 ENCOUNTER — Other Ambulatory Visit (INDEPENDENT_AMBULATORY_CARE_PROVIDER_SITE_OTHER): Payer: Medicare Other

## 2017-09-29 ENCOUNTER — Encounter: Payer: Self-pay | Admitting: Internal Medicine

## 2017-09-29 VITALS — BP 124/80 | HR 82 | Temp 98.3°F | Resp 20 | Ht 66.0 in | Wt 164.0 lb

## 2017-09-29 DIAGNOSIS — E559 Vitamin D deficiency, unspecified: Secondary | ICD-10-CM

## 2017-09-29 DIAGNOSIS — Z0000 Encounter for general adult medical examination without abnormal findings: Secondary | ICD-10-CM | POA: Diagnosis not present

## 2017-09-29 DIAGNOSIS — Z23 Encounter for immunization: Secondary | ICD-10-CM

## 2017-09-29 DIAGNOSIS — Z0001 Encounter for general adult medical examination with abnormal findings: Secondary | ICD-10-CM | POA: Insufficient documentation

## 2017-09-29 LAB — VITAMIN D 25 HYDROXY (VIT D DEFICIENCY, FRACTURES): VITD: 40.79 ng/mL (ref 30.00–100.00)

## 2017-09-29 LAB — TSH: TSH: 2.94 u[IU]/mL (ref 0.35–4.50)

## 2017-09-29 NOTE — Assessment & Plan Note (Signed)

## 2017-09-29 NOTE — Progress Notes (Signed)
Subjective:    Patient ID: Linda Soto, female    DOB: April 12, 1941, 76 y.o.   MRN: [id number]  HPI Here for wellness and f/u;  Overall doing ok;  Pt denies Chest pain, worsening SOB, DOE, wheezing, orthopnea, PND, worsening LE edema, palpitations, dizziness or syncope.  Pt denies neurological change such as new headache, facial or extremity weakness.  Pt denies polydipsia, polyuria, or low sugar symptoms. Pt states overall good compliance with treatment and medications, good tolerability, and has been trying to follow appropriate diet.  Pt denies worsening depressive symptoms, suicidal ideation or panic. No fever, night sweats, wt loss, loss of appetite, or other constitutional symptoms.  Pt states good ability with ADL's, has low fall risk, home safety reviewed and adequate, no other significant changes in hearing or vision, and occasionally active with exercise.  Seeing Dr Nickola Major for rheum.  Decliens lipids but due for f/u Vit D and TSH Past Medical History:  Diagnosis Date  . Allergic rhinitis   . Dense breasts    f/u mammo rec 2013 because of same  . Diverticular disease of left colon   . Menopausal symptom 1997  . Osteopenia    at femur, follows DEXA with gyn q 46yrs   Past Surgical History:  Procedure Laterality Date  . NO PAST SURGERIES      reports that she has quit smoking. Her smoking use included cigarettes. she has never used smokeless tobacco. She reports that she does not drink alcohol or use drugs. family history includes Arthritis in her mother; Breast cancer in her maternal aunt; Lung cancer in her maternal aunt; Myasthenia gravis (age of onset: 82) in her mother; Osteoporosis in her mother; Prostate cancer (age of onset: 30) in her father; Uterine cancer in her maternal aunt. No Known Allergies Current Outpatient Medications on File Prior to Visit  Medication Sig Dispense Refill  . folic acid (FOLVITE) 1 MG tablet Take 1 mg by mouth daily.  0  . hydroxychloroquine  (PLAQUENIL) 200 MG tablet Take 200 mg daily by mouth.    . methotrexate (RHEUMATREX) 2.5 MG tablet 2.5 mg.   0  . Multiple Vitamins-Minerals (CENTRUM ADULTS PO) Take by mouth.     No current facility-administered medications on file prior to visit.    Review of Systems Constitutional: Negative for other unusual diaphoresis, sweats, appetite or weight changes HENT: Negative for other worsening hearing loss, ear pain, facial swelling, mouth sores or neck stiffness.   Eyes: Negative for other worsening pain, redness or other visual disturbance.  Respiratory: Negative for other stridor or swelling Cardiovascular: Negative for other palpitations or other chest pain  Gastrointestinal: Negative for worsening diarrhea or loose stools, blood in stool, distention or other pain Genitourinary: Negative for hematuria, flank pain or other change in urine volume.  Musculoskeletal: Negative for myalgias or other joint swelling.  Skin: Negative for other color change, or other wound or worsening drainage.  Neurological: Negative for other syncope or numbness. Hematological: Negative for other adenopathy or swelling Psychiatric/Behavioral: Negative for hallucinations, other worsening agitation, SI, self-injury, or new decreased concentration ALl other system neg per pt    Objective:   Physical Exam BP 124/80   Pulse 82   Temp 98.3 F (36.8 C)   Resp 20   Ht 5\' 6"  (1.676 m)   Wt 164 lb (74.4 kg)   SpO2 98%   BMI 26.47 kg/m  VS noted,  Constitutional: Pt is oriented to person, place, and time. Appears well-developed  and well-nourished, in no significant distress and comfortable Head: Normocephalic and atraumatic  Eyes: Conjunctivae and EOM are normal. Pupils are equal, round, and reactive to light Right Ear: External ear normal without discharge Left Ear: External ear normal without discharge Nose: Nose without discharge or deformity Mouth/Throat: Oropharynx is without other ulcerations and moist   Neck: Normal range of motion. Neck supple. No JVD present. No tracheal deviation present or significant neck LA or mass Cardiovascular: Normal rate, regular rhythm, normal heart sounds and intact distal pulses.   Pulmonary/Chest: WOB normal and breath sounds without rales or wheezing  Abdominal: Soft. Bowel sounds are normal. NT. No HSM  Musculoskeletal: Normal range of motion. Exhibits no edema, no joint effusions Lymphadenopathy: Has no other cervical adenopathy.  Neurological: Pt is alert and oriented to person, place, and time. Pt has normal reflexes. No cranial nerve deficit. Motor grossly intact, Gait intact Skin: Skin is warm and dry. No rash noted or new ulcerations Psychiatric:  Has normal mood and affect. Behavior is normal without agitation No other exam findings Lab Results  Component Value Date   WBC 5.5 02/18/2017   HGB 13.9 02/18/2017   HCT 41 02/18/2017   PLT 261 02/18/2017   GLUCOSE 109 (H) 12/14/2015   CHOL 169 09/06/2016   TRIG 119.0 09/06/2016   HDL 64.40 09/06/2016   LDLCALC 81 09/06/2016   ALT 15 02/18/2017   AST 19 02/18/2017   NA 141 02/18/2017   K 4.2 02/18/2017   CL 106 12/14/2015   CREATININE 0.8 02/18/2017   BUN 11 02/18/2017   BUN 11 02/18/2017   CO2 26 12/14/2015   TSH 2.94 09/29/2017   HGBA1C 5.6 12/17/2013      Assessment & Plan:

## 2017-09-29 NOTE — Patient Instructions (Addendum)
You had the flu shot today  We will try to sign you up for the Cologuard  Please continue all other medications as before, and refills have been done if requested.  Please have the pharmacy call with any other refills you may need.  Please continue your efforts at being more active, low cholesterol diet, and weight control.  You are otherwise up to date with prevention measures today.  Please keep your appointments with your specialists as you may have planned  Please go to the LAB in the Basement (turn left off the elevator) for the tests to be done today  You will be contacted by phone if any changes need to be made immediately.  Otherwise, you will receive a letter about your results with an explanation, but please check with MyChart first.  Please remember to sign up for MyChart if you have not done so, as this will be important to you in the future with finding out test results, communicating by private email, and scheduling acute appointments online when needed.  Please return in 1 year for your yearly visit, or sooner if needed  Continue doing brain stimulating activities (puzzles, reading, adult coloring books, staying active) to keep memory sharp.   Continue to eat heart healthy diet (full of fruits, vegetables, whole grains, lean protein, water--limit salt, fat, and sugar intake) and increase physical activity as tolerated.   Linda Soto , Thank you for taking time to come for your Medicare Wellness Visit. I appreciate your ongoing commitment to your health goals. Please review the following plan we discussed and let me know if I can assist you in the future.   These are the goals we discussed: Goals    . Stay healthy and active     Continue to exercise, eat healthy, hike, garden and enjoy life       This is a list of the screening recommended for you and due dates:  Health Maintenance  Topic Date Due  . Colon Cancer Screening  11/11/2016  . Flu Shot  06/11/2017  .  Mammogram  07/12/2017  . Tetanus Vaccine  09/02/2023  . DEXA scan (bone density measurement)  Completed  . Pneumonia vaccines  Completed   Influenza Virus Vaccine injection What is this medicine? INFLUENZA VIRUS VACCINE (in floo EN zuh VAHY ruhs vak SEEN) helps to reduce the risk of getting influenza also known as the flu. The vaccine only helps protect you against some strains of the flu. This medicine may be used for other purposes; ask your health care provider or pharmacist if you have questions. COMMON BRAND NAME(S): Afluria, Agriflu, Alfuria, FLUAD, Fluarix, Fluarix Quadrivalent, Flublok, Flublok Quadrivalent, FLUCELVAX, Flulaval, Fluvirin, Fluzone, Fluzone High-Dose, Fluzone Intradermal What should I tell my health care provider before I take this medicine? They need to know if you have any of these conditions: -bleeding disorder like hemophilia -fever or infection -Guillain-Barre syndrome or other neurological problems -immune system problems -infection with the human immunodeficiency virus (HIV) or AIDS -low blood platelet counts -multiple sclerosis -an unusual or allergic reaction to influenza virus vaccine, latex, other medicines, foods, dyes, or preservatives. Different brands of vaccines contain different allergens. Some may contain latex or eggs. Talk to your doctor about your allergies to make sure that you get the right vaccine. -pregnant or trying to get pregnant -breast-feeding How should I use this medicine? This vaccine is for injection into a muscle or under the skin. It is given by a health care professional. A  copy of Vaccine Information Statements will be given before each vaccination. Read this sheet carefully each time. The sheet may change frequently. Talk to your healthcare provider to see which vaccines are right for you. Some vaccines should not be used in all age groups. Overdosage: If you think you have taken too much of this medicine contact a poison control  center or emergency room at once. NOTE: This medicine is only for you. Do not share this medicine with others. What if I miss a dose? This does not apply. What may interact with this medicine? -chemotherapy or radiation therapy -medicines that lower your immune system like etanercept, anakinra, infliximab, and adalimumab -medicines that treat or prevent blood clots like warfarin -phenytoin -steroid medicines like prednisone or cortisone -theophylline -vaccines This list may not describe all possible interactions. Give your health care provider a list of all the medicines, herbs, non-prescription drugs, or dietary supplements you use. Also tell them if you smoke, drink alcohol, or use illegal drugs. Some items may interact with your medicine. What should I watch for while using this medicine? Report any side effects that do not go away within 3 days to your doctor or health care professional. Call your health care provider if any unusual symptoms occur within 6 weeks of receiving this vaccine. You may still catch the flu, but the illness is not usually as bad. You cannot get the flu from the vaccine. The vaccine will not protect against colds or other illnesses that may cause fever. The vaccine is needed every year. What side effects may I notice from receiving this medicine? Side effects that you should report to your doctor or health care professional as soon as possible: -allergic reactions like skin rash, itching or hives, swelling of the face, lips, or tongue Side effects that usually do not require medical attention (report to your doctor or health care professional if they continue or are bothersome): -fever -headache -muscle aches and pains -pain, tenderness, redness, or swelling at the injection site -tiredness This list may not describe all possible side effects. Call your doctor for medical advice about side effects. You may report side effects to FDA at 1-800-FDA-1088. Where  should I keep my medicine? The vaccine will be given by a health care professional in a clinic, pharmacy, doctor's office, or other health care setting. You will not be given vaccine doses to store at home. NOTE: This sheet is a summary. It may not cover all possible information. If you have questions about this medicine, talk to your doctor, pharmacist, or health care provider.  2018 Elsevier/Gold Standard (2015-05-19 10:07:28)

## 2018-09-23 ENCOUNTER — Ambulatory Visit (INDEPENDENT_AMBULATORY_CARE_PROVIDER_SITE_OTHER): Payer: Medicare Other

## 2018-09-23 DIAGNOSIS — Z23 Encounter for immunization: Secondary | ICD-10-CM

## 2018-10-23 ENCOUNTER — Encounter: Payer: Self-pay | Admitting: Internal Medicine

## 2018-10-23 ENCOUNTER — Ambulatory Visit (INDEPENDENT_AMBULATORY_CARE_PROVIDER_SITE_OTHER): Payer: Medicare Other | Admitting: Internal Medicine

## 2018-10-23 VITALS — BP 112/78 | HR 79 | Temp 97.8°F | Ht 66.0 in | Wt 165.0 lb

## 2018-10-23 DIAGNOSIS — Z0000 Encounter for general adult medical examination without abnormal findings: Secondary | ICD-10-CM | POA: Diagnosis not present

## 2018-10-23 DIAGNOSIS — R739 Hyperglycemia, unspecified: Secondary | ICD-10-CM | POA: Diagnosis not present

## 2018-10-23 NOTE — Progress Notes (Signed)
Subjective:    Patient ID: Linda Soto, female    DOB: 1941-07-30, 77 y.o.   MRN: [id number]  HPI  Here for wellness and f/u;  Overall doing ok;  Pt denies Chest pain, worsening SOB, DOE, wheezing, orthopnea, PND, worsening LE edema, palpitations, dizziness or syncope.  Pt denies neurological change such as new headache, facial or extremity weakness.  Pt denies polydipsia, polyuria, or low sugar symptoms. Pt states overall good compliance with treatment and medications, good tolerability, and has been trying to follow appropriate diet.  Pt denies worsening depressive symptoms, suicidal ideation or panic. No fever, night sweats, wt loss, loss of appetite, or other constitutional symptoms.  Pt states good ability with ADL's, has low fall risk, home safety reviewed and adequate, no other significant changes in hearing or vision, and only occasionally active with exercise.  Sees rheum every 3-6 mo now in RA remission.  No new complaints Past Medical History:  Diagnosis Date  . Allergic rhinitis   . Dense breasts    f/u mammo rec 2013 because of same  . Diverticular disease of left colon   . Menopausal symptom 1997  . Osteopenia    at femur, follows DEXA with gyn q 40yrs   Past Surgical History:  Procedure Laterality Date  . NO PAST SURGERIES      reports that she has quit smoking. Her smoking use included cigarettes. She has never used smokeless tobacco. She reports that she does not drink alcohol or use drugs. family history includes Arthritis in her mother; Breast cancer in her maternal aunt; Lung cancer in her maternal aunt; Myasthenia gravis (age of onset: 56) in her mother; Osteoporosis in her mother; Prostate cancer (age of onset: 71) in her father; Uterine cancer in her maternal aunt. No Known Allergies Current Outpatient Medications on File Prior to Visit  Medication Sig Dispense Refill  . folic acid (FOLVITE) 1 MG tablet Take 1 mg by mouth daily.  0  . hydroxychloroquine (PLAQUENIL)  200 MG tablet Take 200 mg daily by mouth.    . methotrexate (RHEUMATREX) 2.5 MG tablet 2.5 mg.   0  . Multiple Vitamins-Minerals (CENTRUM ADULTS PO) Take by mouth.     No current facility-administered medications on file prior to visit.    Review of Systems  Constitutional: Negative for other unusual diaphoresis or sweats HENT: Negative for ear discharge or swelling Eyes: Negative for other worsening visual disturbances Respiratory: Negative for stridor or other swelling  Gastrointestinal: Negative for worsening distension or other blood Genitourinary: Negative for retention or other urinary change Musculoskeletal: Negative for other MSK pain or swelling Skin: Negative for color change or other new lesions Neurological: Negative for worsening tremors and other numbness  Psychiatric/Behavioral: Negative for worsening agitation or other fatigue All other system neg per pt    Objective:   Physical Exam BP 112/78   Pulse 79   Temp 97.8 F (36.6 C) (Oral)   Ht 5\' 6"  (1.676 m)   Wt 165 lb (74.8 kg)   SpO2 95%   BMI 26.63 kg/m  VS noted,  Constitutional: Pt appears in NAD HENT: Head: NCAT.  Right Ear: External ear normal.  Left Ear: External ear normal.  Eyes: . Pupils are equal, round, and reactive to light. Conjunctivae and EOM are normal Nose: without d/c or deformity Neck: Neck supple. Gross normal ROM Cardiovascular: Normal rate and regular rhythm.   Pulmonary/Chest: Effort normal and breath sounds without rales or wheezing.  Abd:  Soft,  NT, ND, + BS, no organomegaly Neurological: Pt is alert. At baseline orientation, motor grossly intact Skin: Skin is warm. No rashes, other new lesions, no LE edema Psychiatric: Pt behavior is normal without agitation  No other exam findings Lab Results  Component Value Date   WBC 5.5 02/18/2017   HGB 13.9 02/18/2017   HCT 41 02/18/2017   PLT 261 02/18/2017   GLUCOSE 109 (H) 12/14/2015   CHOL 169 09/06/2016   TRIG 119.0 09/06/2016     HDL 64.40 09/06/2016   LDLCALC 81 09/06/2016   ALT 15 02/18/2017   AST 19 02/18/2017   NA 141 02/18/2017   K 4.2 02/18/2017   CL 106 12/14/2015   CREATININE 0.8 02/18/2017   BUN 11 02/18/2017   BUN 11 02/18/2017   CO2 26 12/14/2015   TSH 2.94 09/29/2017   HGBA1C 5.6 12/17/2013       Assessment & Plan:

## 2018-10-23 NOTE — Patient Instructions (Signed)

## 2018-10-24 NOTE — Assessment & Plan Note (Signed)

## 2018-10-24 NOTE — Assessment & Plan Note (Signed)
Also for a1c with labs 

## 2018-10-28 ENCOUNTER — Telehealth: Payer: Self-pay

## 2018-10-28 ENCOUNTER — Other Ambulatory Visit (INDEPENDENT_AMBULATORY_CARE_PROVIDER_SITE_OTHER): Payer: Medicare Other

## 2018-10-28 ENCOUNTER — Other Ambulatory Visit: Payer: Self-pay | Admitting: Internal Medicine

## 2018-10-28 DIAGNOSIS — R739 Hyperglycemia, unspecified: Secondary | ICD-10-CM

## 2018-10-28 DIAGNOSIS — Z0000 Encounter for general adult medical examination without abnormal findings: Secondary | ICD-10-CM | POA: Diagnosis not present

## 2018-10-28 LAB — URINALYSIS, ROUTINE W REFLEX MICROSCOPIC
Bilirubin Urine: NEGATIVE
Hgb urine dipstick: NEGATIVE
Ketones, ur: NEGATIVE
Leukocytes, UA: NEGATIVE
Nitrite: NEGATIVE
RBC / HPF: NONE SEEN (ref 0–?)
Specific Gravity, Urine: 1.015 (ref 1.000–1.030)
Total Protein, Urine: NEGATIVE
Urine Glucose: NEGATIVE
Urobilinogen, UA: 0.2 (ref 0.0–1.0)
WBC, UA: NONE SEEN (ref 0–?)
pH: 6.5 (ref 5.0–8.0)

## 2018-10-28 LAB — CBC WITH DIFFERENTIAL/PLATELET
Basophils Absolute: 0 10*3/uL (ref 0.0–0.1)
Basophils Relative: 0.8 % (ref 0.0–3.0)
Eosinophils Absolute: 0.2 10*3/uL (ref 0.0–0.7)
Eosinophils Relative: 4.4 % (ref 0.0–5.0)
HCT: 43.6 % (ref 36.0–46.0)
Hemoglobin: 15.1 g/dL — ABNORMAL HIGH (ref 12.0–15.0)
Lymphocytes Relative: 28.2 % (ref 12.0–46.0)
Lymphs Abs: 1.2 10*3/uL (ref 0.7–4.0)
MCHC: 34.7 g/dL (ref 30.0–36.0)
MCV: 97.6 fl (ref 78.0–100.0)
Monocytes Absolute: 0.2 10*3/uL (ref 0.1–1.0)
Monocytes Relative: 5.7 % (ref 3.0–12.0)
Neutro Abs: 2.6 10*3/uL (ref 1.4–7.7)
Neutrophils Relative %: 60.9 % (ref 43.0–77.0)
Platelets: 230 10*3/uL (ref 150.0–400.0)
RBC: 4.47 Mil/uL (ref 3.87–5.11)
RDW: 15.1 % (ref 11.5–15.5)
WBC: 4.2 10*3/uL (ref 4.0–10.5)

## 2018-10-28 LAB — BASIC METABOLIC PANEL
BUN: 12 mg/dL (ref 6–23)
CO2: 28 mEq/L (ref 19–32)
Calcium: 9.3 mg/dL (ref 8.4–10.5)
Chloride: 105 mEq/L (ref 96–112)
Creatinine, Ser: 0.81 mg/dL (ref 0.40–1.20)
GFR: 88.56 mL/min (ref >60.00)
Glucose, Bld: 96 mg/dL (ref 70–99)
Potassium: 4.4 mEq/L (ref 3.5–5.1)
Sodium: 141 mEq/L (ref 135–145)

## 2018-10-28 LAB — HEPATIC FUNCTION PANEL
ALT: 55 U/L — ABNORMAL HIGH (ref 0–35)
AST: 39 U/L — ABNORMAL HIGH (ref 0–37)
Albumin: 4.3 g/dL (ref 3.5–5.2)
Alkaline Phosphatase: 80 U/L (ref 39–117)
Bilirubin, Direct: 0.1 mg/dL (ref 0.0–0.3)
Total Bilirubin: 0.7 mg/dL (ref 0.2–1.2)
Total Protein: 7.1 g/dL (ref 6.0–8.3)

## 2018-10-28 LAB — LIPID PANEL
Cholesterol: 158 mg/dL (ref 0–200)
HDL: 84.4 mg/dL (ref >39.00)
LDL Cholesterol: 65 mg/dL (ref 0–99)
NonHDL: 73.2
Total CHOL/HDL Ratio: 2
Triglycerides: 43 mg/dL (ref 0.0–149.0)
VLDL: 8.6 mg/dL (ref 0.0–40.0)

## 2018-10-28 LAB — TSH: TSH: 5.91 u[IU]/mL — ABNORMAL HIGH (ref 0.35–4.50)

## 2018-10-28 LAB — HEMOGLOBIN A1C: Hgb A1c MFr Bld: 5.5 % (ref 4.6–6.5)

## 2018-10-28 MED ORDER — LEVOTHYROXINE SODIUM 25 MCG PO TABS: 25.0000 ug | ORAL_TABLET | Freq: Every day | ORAL | 3 refills | Status: DC

## 2018-10-28 NOTE — Telephone Encounter (Signed)
Pt has viewed results via MyChart  

## 2018-10-28 NOTE — Telephone Encounter (Signed)
-----   Message from Corwin Levins, MD sent at 10/28/2018 12:50 PM EST ----- Left message on MyChart, pt to cont same tx except  The test results show that your current treatment is OK, except the thyroid test is consistent with a mild low thyroid condition.  We should add a very low dose of thyroid medication called levothyroxine 25 mcg per day.  I will send the prescription, and you should hear from the office as well    Linda Soto to please inform pt, I will do rx

## 2019-08-27 ENCOUNTER — Other Ambulatory Visit: Payer: Self-pay

## 2019-08-27 ENCOUNTER — Other Ambulatory Visit: Payer: Medicare Other

## 2019-08-27 ENCOUNTER — Ambulatory Visit (INDEPENDENT_AMBULATORY_CARE_PROVIDER_SITE_OTHER): Payer: Medicare Other

## 2019-08-27 DIAGNOSIS — Z23 Encounter for immunization: Secondary | ICD-10-CM | POA: Diagnosis not present

## 2019-10-05 NOTE — Progress Notes (Addendum)
Subjective:   Linda Soto is a 78 y.o. female who presents for Medicare Annual (Subsequent) preventive examination. I connected with patient by a telephone and verified that I am speaking with the correct person using two identifiers. Patient stated full name and DOB. Patient gave permission to continue with telephonic visit. Patient's location was at home and Nurse's location was at [place] office. Participants during this visit included patient and nurse.  Review of Systems:     Sleep patterns: feels rested on waking, gets up 1 times nightly to void and sleeps 7 hours nightly.    Home Safety/Smoke Alarms: Feels safe in home. Smoke alarms in place.  Living environment; residence and Firearm Safety: 1-story house/ trailer. Lives alone, no needs for DME, good support system Seat Belt Safety/Bike Helmet: Wears seat belt.      Objective:     Vitals: There were no vitals taken for this visit.  There is no height or weight on file to calculate BMI.  Advanced Directives 09/29/2017  Does Patient Have a Medical Advance Directive? Yes  Type of Paramedic of [place];Living will  Copy of [place] in Chart? No - copy requested    Tobacco Social History   Tobacco Use  Smoking Status Former Smoker  . Types: Cigarettes  Smokeless Tobacco Never Used  Tobacco Comment   quit in 1975     Counseling given: Not Answered Comment: quit in 1975  Past Medical History:  Diagnosis Date  . Allergic rhinitis   . Dense breasts    f/u mammo rec 2013 because of same  . Diverticular disease of left colon   . Menopausal symptom 1997  . Osteopenia    at femur, follows DEXA with gyn q 49yrs   Past Surgical History:  Procedure Laterality Date  . NO PAST SURGERIES     Family History  Problem Relation Age of Onset  . Prostate cancer Father 79  . Arthritis Mother   . Osteoporosis Mother        vertebral compression fx  . Myasthenia gravis Mother  62  . Lung cancer Maternal Aunt   . Breast cancer Maternal Aunt   . Uterine cancer Maternal Aunt    Social History   Socioeconomic History  . Marital status: Divorced    Spouse name: Not on file  . Number of children: Not on file  . Years of education: Not on file  . Highest education level: Not on file  Occupational History  . Not on file  Social Needs  . Financial resource strain: Not on file  . Food insecurity    Worry: Not on file    Inability: Not on file  . Transportation needs    Medical: Not on file    Non-medical: Not on file  Tobacco Use  . Smoking status: Former Smoker    Types: Cigarettes  . Smokeless tobacco: Never Used  . Tobacco comment: quit in 1975  Substance and Sexual Activity  . Alcohol use: No    Alcohol/week: 0.0 standard drinks    Frequency: Never  . Drug use: No  . Sexual activity: Never  Lifestyle  . Physical activity    Days per week: Not on file    Minutes per session: Not on file  . Stress: Not on file  Relationships  . Social Herbalist on phone: Not on file    Gets together: Not on file    Attends religious  service: Not on file    Active member of club or organization: Not on file    Attends meetings of clubs or organizations: Not on file    Relationship status: Not on file  Other Topics Concern  . Not on file  Social History Narrative   divorced, lives alone - retired from [employer] in 2011    Outpatient Encounter Medications as of 10/11/2019  Medication Sig  . folic acid (FOLVITE) 1 MG tablet Take 1 mg by mouth daily.  . hydroxychloroquine (PLAQUENIL) 200 MG tablet Take 200 mg daily by mouth.  . levothyroxine (SYNTHROID, LEVOTHROID) 25 MCG tablet Take 1 tablet (25 mcg total) by mouth daily before breakfast.  . methotrexate (RHEUMATREX) 2.5 MG tablet 2.5 mg.   . Multiple Vitamins-Minerals (CENTRUM ADULTS PO) Take by mouth.   No facility-administered encounter medications on file as of 10/11/2019.      Activities of Daily Living No flowsheet data found.  Patient Care Team: Corwin Levins, MD as PCP - General (Internal Medicine) Pennie Rushing Maris Berger, MD (Inactive) (Obstetrics and Gynecology) Charna Elizabeth, MD (Gastroenterology) Mateo Flow, MD (Ophthalmology)    Assessment:   This is a routine wellness examination for Linda Soto. Physical assessment deferred to PCP.   Exercise Activities and Dietary recommendations   Diet (meal preparation, eat out, water intake, caffeinated beverages, dairy products, fruits and vegetables): in general, a "healthy" diet  , well balanced eats a variety of fruits and vegetables daily.  Encouraged patient to increase daily water and healthy fluid intake.  Goals    . Stay healthy and active     Continue to exercise, eat healthy, hike, garden and enjoy life       Fall Risk Fall Risk  10/23/2018 09/29/2017 09/04/2015 08/19/2013  Falls in the past year? 0 No No No   Is the patient's home free of loose throw rugs in walkways, pet beds, electrical cords, etc?   yes      Grab bars in the bathroom? yes      Handrails on the stairs?   yes      Adequate lighting?   yes  Depression Screen PHQ 2/9 Scores 10/23/2018 09/29/2017 09/04/2015 08/19/2013  PHQ - 2 Score 0 0 0 0  PHQ- 9 Score - 1 - -     Cognitive Function MMSE - Mini Mental State Exam 09/29/2017  Orientation to time 5  Orientation to Place 5  Registration 3  Attention/ Calculation 5  Recall 3  Language- name 2 objects 2  Language- repeat 1  Language- follow 3 step command 3  Language- read & follow direction 1  Write a sentence 1  Copy design 1  Total score 30       Ad8 score reviewed for issues:  Issues making decisions: no  Less interest in hobbies / activities: no  Repeats questions, stories (family complaining): no  Trouble using ordinary gadgets (microwave, computer, phone):no  Forgets the month or year: no  Mismanaging finances: no  Remembering appts: no  Daily  problems with thinking and/or memory: no Ad8 score is= 0  Immunization History  Administered Date(s) Administered  . Fluad Quad(high Dose 65+) 08/27/2019  . Influenza, High Dose Seasonal PF 09/01/2013, 09/20/2014, 09/29/2017, 09/23/2018  . Influenza,inj,Quad PF,6+ Mos 09/04/2015  . Pneumococcal Conjugate-13 09/04/2015  . Pneumococcal-Unspecified 11/11/2010  . Tetanus 09/01/2013  . Zoster 10/27/2015  . Zoster Recombinat (Shingrix) 10/08/2017, 11/28/2017   Screening Tests Health Maintenance  Topic Date Due  . TETANUS/TDAP  09/02/2023  . INFLUENZA VACCINE  Completed  . DEXA SCAN  Completed  . PNA vac Low Risk Adult  Completed      Plan:   Reviewed health maintenance screenings with patient today and relevant education, vaccines, and/or referrals were provided.   Continue to eat heart healthy diet (full of fruits, vegetables, whole grains, lean protein, water--limit salt, fat, and sugar intake) and increase physical activity as tolerated.  Continue doing brain stimulating activities (puzzles, reading, adult coloring books, staying active) to keep memory sharp.   I have personally reviewed and noted the following in the patient's chart:   . Medical and social history . Use of alcohol, tobacco or illicit drugs  . Current medications and supplements . Functional ability and status . Nutritional status . Physical activity . Advanced directives . List of other physicians . Screenings to include cognitive, depression, and falls . Referrals and appointments  In addition, I have reviewed and discussed with patient certain preventive protocols, quality metrics, and best practice recommendations. A written personalized care plan for preventive services as well as general preventive health recommendations were provided to patient.     Wanda Plump, RN  10/05/2019   Medical screening examination/treatment/procedure(s) were performed by non-physician practitioner and as supervising  physician I was immediately available for consultation/collaboration. I agree with above. Sanda Linger, MD

## 2019-10-11 ENCOUNTER — Ambulatory Visit (INDEPENDENT_AMBULATORY_CARE_PROVIDER_SITE_OTHER): Payer: Medicare Other | Admitting: *Deleted

## 2019-10-11 DIAGNOSIS — Z0000 Encounter for general adult medical examination without abnormal findings: Secondary | ICD-10-CM

## 2019-10-25 ENCOUNTER — Other Ambulatory Visit: Payer: Self-pay

## 2019-10-25 ENCOUNTER — Other Ambulatory Visit: Payer: Self-pay | Admitting: Internal Medicine

## 2019-10-25 ENCOUNTER — Encounter: Payer: Self-pay | Admitting: Internal Medicine

## 2019-10-25 ENCOUNTER — Other Ambulatory Visit (INDEPENDENT_AMBULATORY_CARE_PROVIDER_SITE_OTHER): Payer: Medicare Other

## 2019-10-25 ENCOUNTER — Ambulatory Visit (INDEPENDENT_AMBULATORY_CARE_PROVIDER_SITE_OTHER): Payer: Medicare Other | Admitting: Internal Medicine

## 2019-10-25 VITALS — BP 124/78 | HR 100 | Temp 98.5°F | Ht 66.0 in | Wt 167.0 lb

## 2019-10-25 DIAGNOSIS — R739 Hyperglycemia, unspecified: Secondary | ICD-10-CM

## 2019-10-25 DIAGNOSIS — Z0000 Encounter for general adult medical examination without abnormal findings: Secondary | ICD-10-CM

## 2019-10-25 DIAGNOSIS — E538 Deficiency of other specified B group vitamins: Secondary | ICD-10-CM | POA: Diagnosis not present

## 2019-10-25 DIAGNOSIS — R7989 Other specified abnormal findings of blood chemistry: Secondary | ICD-10-CM

## 2019-10-25 DIAGNOSIS — E559 Vitamin D deficiency, unspecified: Secondary | ICD-10-CM

## 2019-10-25 DIAGNOSIS — E611 Iron deficiency: Secondary | ICD-10-CM | POA: Diagnosis not present

## 2019-10-25 LAB — URINALYSIS, ROUTINE W REFLEX MICROSCOPIC
Bilirubin Urine: NEGATIVE
Ketones, ur: NEGATIVE
Nitrite: NEGATIVE
Specific Gravity, Urine: >=1.03 — AB (ref 1.000–1.030)
Total Protein, Urine: NEGATIVE
Urine Glucose: NEGATIVE
Urobilinogen, UA: 0.2 (ref 0.0–1.0)
pH: 6 (ref 5.0–8.0)

## 2019-10-25 LAB — IBC PANEL
Iron: 131 ug/dL (ref 42–145)
Saturation Ratios: 48 % (ref 20.0–50.0)
Transferrin: 195 mg/dL — ABNORMAL LOW (ref 212.0–360.0)

## 2019-10-25 LAB — BASIC METABOLIC PANEL
BUN: 13 mg/dL (ref 6–23)
CO2: 28 mEq/L (ref 19–32)
Calcium: 9.3 mg/dL (ref 8.4–10.5)
Chloride: 105 mEq/L (ref 96–112)
Creatinine, Ser: 0.84 mg/dL (ref 0.40–1.20)
GFR: 79.68 mL/min (ref >60.00)
Glucose, Bld: 93 mg/dL (ref 70–99)
Potassium: 3.9 mEq/L (ref 3.5–5.1)
Sodium: 141 mEq/L (ref 135–145)

## 2019-10-25 LAB — CBC WITH DIFFERENTIAL/PLATELET
Basophils Absolute: 0 10*3/uL (ref 0.0–0.1)
Basophils Relative: 0.7 % (ref 0.0–3.0)
Eosinophils Absolute: 0.1 10*3/uL (ref 0.0–0.7)
Eosinophils Relative: 1.6 % (ref 0.0–5.0)
HCT: 44 % (ref 36.0–46.0)
Hemoglobin: 14.9 g/dL (ref 12.0–15.0)
Lymphocytes Relative: 19.4 % (ref 12.0–46.0)
Lymphs Abs: 1.1 10*3/uL (ref 0.7–4.0)
MCHC: 33.8 g/dL (ref 30.0–36.0)
MCV: 98.8 fl (ref 78.0–100.0)
Monocytes Absolute: 0.4 10*3/uL (ref 0.1–1.0)
Monocytes Relative: 7.3 % (ref 3.0–12.0)
Neutro Abs: 4.1 10*3/uL (ref 1.4–7.7)
Neutrophils Relative %: 71 % (ref 43.0–77.0)
Platelets: 236 10*3/uL (ref 150.0–400.0)
RBC: 4.45 Mil/uL (ref 3.87–5.11)
RDW: 15.5 % (ref 11.5–15.5)
WBC: 5.7 10*3/uL (ref 4.0–10.5)

## 2019-10-25 LAB — HEPATIC FUNCTION PANEL
ALT: 21 U/L (ref 0–35)
AST: 23 U/L (ref 0–37)
Albumin: 4.4 g/dL (ref 3.5–5.2)
Alkaline Phosphatase: 82 U/L (ref 39–117)
Bilirubin, Direct: 0.1 mg/dL (ref 0.0–0.3)
Total Bilirubin: 0.6 mg/dL (ref 0.2–1.2)
Total Protein: 7.2 g/dL (ref 6.0–8.3)

## 2019-10-25 LAB — LIPID PANEL
Cholesterol: 155 mg/dL (ref 0–200)
HDL: 74.5 mg/dL (ref >39.00)
LDL Cholesterol: 72 mg/dL (ref 0–99)
NonHDL: 80.95
Total CHOL/HDL Ratio: 2
Triglycerides: 46 mg/dL (ref 0.0–149.0)
VLDL: 9.2 mg/dL (ref 0.0–40.0)

## 2019-10-25 LAB — T4, FREE: Free T4: 0.92 ng/dL (ref 0.60–1.60)

## 2019-10-25 LAB — VITAMIN D 25 HYDROXY (VIT D DEFICIENCY, FRACTURES): VITD: 43.41 ng/mL (ref 30.00–100.00)

## 2019-10-25 LAB — VITAMIN B12: Vitamin B-12: 1017 pg/mL — ABNORMAL HIGH (ref 211–911)

## 2019-10-25 LAB — TSH: TSH: 3.96 u[IU]/mL (ref 0.35–4.50)

## 2019-10-25 LAB — HEMOGLOBIN A1C: Hgb A1c MFr Bld: 5.3 % (ref 4.6–6.5)

## 2019-10-25 MED ORDER — CIPROFLOXACIN HCL 500 MG PO TABS: 500.0000 mg | ORAL_TABLET | Freq: Two times a day (BID) | ORAL | 0 refills | Status: AC

## 2019-10-25 NOTE — Assessment & Plan Note (Signed)
stable overall by history and exam, recent data reviewed with pt, and pt to continue medical treatment as before,  to f/u any worsening symptoms or concerns  

## 2019-10-25 NOTE — Progress Notes (Signed)
Subjective:    Patient ID: Linda Soto, female    DOB: May 14, 1941, 78 y.o.   MRN: [id number]  HPI  Here for wellness and f/u;  Overall doing ok;  Pt denies Chest pain, worsening SOB, DOE, wheezing, orthopnea, PND, worsening LE edema, palpitations, dizziness or syncope.  Pt denies neurological change such as new headache, facial or extremity weakness.  Pt denies polydipsia, polyuria, or low sugar symptoms. Pt states overall good compliance with treatment and medications, good tolerability, and has been trying to follow appropriate diet.  Pt denies worsening depressive symptoms, suicidal ideation or panic. No fever, night sweats, wt loss, loss of appetite, or other constitutional symptoms.  Pt states good ability with ADL's, has low fall risk, home safety reviewed and adequate, no other significant changes in hearing or vision, and only occasionally active with exercise. RA in remission per rheum. No new complaints Past Medical History:  Diagnosis Date  . Allergic rhinitis   . Dense breasts    f/u mammo rec 2013 because of same  . Diverticular disease of left colon   . Menopausal symptom 1997  . Osteopenia    at femur, follows DEXA with gyn q 69yrs  . RA (rheumatoid arthritis) ([place])    Past Surgical History:  Procedure Laterality Date  . NO PAST SURGERIES      reports that she has quit smoking. Her smoking use included cigarettes. She has never used smokeless tobacco. She reports current alcohol use. She reports that she does not use drugs. family history includes Arthritis in her mother; Breast cancer in her maternal aunt; Lung cancer in her maternal aunt; Myasthenia gravis (age of onset: 63) in her mother; Osteoporosis in her mother; Prostate cancer (age of onset: 35) in her father; Uterine cancer in her maternal aunt. No Known Allergies Current Outpatient Medications on File Prior to Visit  Medication Sig Dispense Refill  . calcium citrate-vitamin D (CITRACAL+D) 315-200 MG-UNIT tablet  Take 1 tablet by mouth 2 (two) times daily.    . folic acid (FOLVITE) 1 MG tablet Take 1 mg by mouth daily.  0  . hydroxychloroquine (PLAQUENIL) 200 MG tablet Take 200 mg by mouth 2 (two) times daily.    . methotrexate (RHEUMATREX) 2.5 MG tablet 12.5 mg 2 (two) times a week.  0  . Multiple Vitamins-Minerals (CENTRUM ADULTS PO) Take by mouth.     No current facility-administered medications on file prior to visit.   Review of Systems Constitutional: Negative for other unusual diaphoresis, sweats, appetite or weight changes HENT: Negative for other worsening hearing loss, ear pain, facial swelling, mouth sores or neck stiffness.   Eyes: Negative for other worsening pain, redness or other visual disturbance.  Respiratory: Negative for other stridor or swelling Cardiovascular: Negative for other palpitations or other chest pain  Gastrointestinal: Negative for worsening diarrhea or loose stools, blood in stool, distention or other pain Genitourinary: Negative for hematuria, flank pain or other change in urine volume.  Musculoskeletal: Negative for myalgias or other joint swelling.  Skin: Negative for other color change, or other wound or worsening drainage.  Neurological: Negative for other syncope or numbness. Hematological: Negative for other adenopathy or swelling Psychiatric/Behavioral: Negative for hallucinations, other worsening agitation, SI, self-injury, or new decreased concentration All otherwise neg per pt     Objective:   Physical Exam BP 124/78   Pulse 100   Temp 98.5 F (36.9 C) (Oral)   Ht 5\' 6"  (1.676 m)   Wt 167 lb (75.8  kg)   SpO2 97%   BMI 26.95 kg/m  VS noted,  Constitutional: Pt is oriented to person, place, and time. Appears well-developed and well-nourished, in no significant distress and comfortable Head: Normocephalic and atraumatic  Eyes: Conjunctivae and EOM are normal. Pupils are equal, round, and reactive to light Right Ear: External ear normal without  discharge Left Ear: External ear normal without discharge Nose: Nose without discharge or deformity Mouth/Throat: Oropharynx is without other ulcerations and moist  Neck: Normal range of motion. Neck supple. No JVD present. No tracheal deviation present or significant neck LA or mass Cardiovascular: Normal rate, regular rhythm, normal heart sounds and intact distal pulses.   Pulmonary/Chest: WOB normal and breath sounds without rales or wheezing  Abdominal: Soft. Bowel sounds are normal. NT. No HSM  Musculoskeletal: Normal range of motion. Exhibits no edema Lymphadenopathy: Has no other cervical adenopathy.  Neurological: Pt is alert and oriented to person, place, and time. Pt has normal reflexes. No cranial nerve deficit. Motor grossly intact, Gait intact Skin: Skin is warm and dry. No rash noted or new ulcerations Psychiatric:  Has normal mood and affect. Behavior is normal without agitation All otherwise neg per pt Lab Results  Component Value Date   WBC 5.7 10/25/2019   HGB 14.9 10/25/2019   HCT 44.0 10/25/2019   PLT 236.0 10/25/2019   GLUCOSE 93 10/25/2019   CHOL 155 10/25/2019   TRIG 46.0 10/25/2019   HDL 74.50 10/25/2019   LDLCALC 72 10/25/2019   ALT 21 10/25/2019   AST 23 10/25/2019   NA 141 10/25/2019   K 3.9 10/25/2019   CL 105 10/25/2019   CREATININE 0.84 10/25/2019   BUN 13 10/25/2019   CO2 28 10/25/2019   TSH 3.96 10/25/2019   HGBA1C 5.3 10/25/2019      Assessment & Plan:

## 2019-10-25 NOTE — Patient Instructions (Addendum)

## 2019-12-03 ENCOUNTER — Ambulatory Visit: Payer: Medicare PPO | Attending: Internal Medicine

## 2019-12-03 DIAGNOSIS — Z23 Encounter for immunization: Secondary | ICD-10-CM | POA: Insufficient documentation

## 2019-12-03 NOTE — Progress Notes (Signed)
   Covid-19 Vaccination Clinic  Name:  Linda Soto    MRN: [id number] DOB: [birth year]  12/03/2019  [name] was observed post Covid-19 immunization for 15 minutes without incidence. She was provided with Vaccine Information Sheet and instruction to access the V-Safe system.   [name] was instructed to call 911 with any severe reactions post vaccine: Marland Kitchen Difficulty breathing  . Swelling of your face and throat  . A fast heartbeat  . A bad rash all over your body  . Dizziness and weakness    Immunizations Administered    Name Date Dose VIS Date Route   Pfizer COVID-19 Vaccine 12/03/2019  5:23 PM 0.3 mL 10/22/2019 Intramuscular   Manufacturer: [employer], [employer]   Lot: [id number]   NDC: [address]-8

## 2019-12-17 DIAGNOSIS — M2550 Pain in unspecified joint: Secondary | ICD-10-CM | POA: Diagnosis not present

## 2019-12-17 DIAGNOSIS — Z79899 Other long term (current) drug therapy: Secondary | ICD-10-CM | POA: Diagnosis not present

## 2019-12-17 DIAGNOSIS — M0609 Rheumatoid arthritis without rheumatoid factor, multiple sites: Secondary | ICD-10-CM | POA: Diagnosis not present

## 2019-12-17 DIAGNOSIS — E663 Overweight: Secondary | ICD-10-CM | POA: Diagnosis not present

## 2019-12-17 DIAGNOSIS — Z6826 Body mass index (BMI) 26.0-26.9, adult: Secondary | ICD-10-CM | POA: Diagnosis not present

## 2019-12-17 DIAGNOSIS — M150 Primary generalized (osteo)arthritis: Secondary | ICD-10-CM | POA: Diagnosis not present

## 2019-12-24 ENCOUNTER — Ambulatory Visit: Payer: Medicare PPO | Attending: Internal Medicine

## 2019-12-24 DIAGNOSIS — Z23 Encounter for immunization: Secondary | ICD-10-CM | POA: Insufficient documentation

## 2019-12-24 NOTE — Progress Notes (Signed)
   Covid-19 Vaccination Clinic  Name:  YUMA BLUCHER    MRN: [id number] DOB: Jul 09, 1941  12/24/2019  Ms. Simic was observed post Covid-19 immunization for 15 minutes without incidence. She was provided with Vaccine Information Sheet and instruction to access the V-Safe system.   Ms. Ruder was instructed to call 911 with any severe reactions post vaccine: Marland Kitchen Difficulty breathing  . Swelling of your face and throat  . A fast heartbeat  . A bad rash all over your body  . Dizziness and weakness    Immunizations Administered    Name Date Dose VIS Date Route   Pfizer COVID-19 Vaccine 12/24/2019 10:54 AM 0.3 mL 10/22/2019 Intramuscular   Manufacturer: [employer], [employer]   Lot: [id number]   NDC: [id number]

## 2020-02-08 ENCOUNTER — Encounter: Payer: Self-pay | Admitting: Internal Medicine

## 2020-03-15 DIAGNOSIS — M0609 Rheumatoid arthritis without rheumatoid factor, multiple sites: Secondary | ICD-10-CM | POA: Diagnosis not present

## 2020-06-16 DIAGNOSIS — M150 Primary generalized (osteo)arthritis: Secondary | ICD-10-CM | POA: Diagnosis not present

## 2020-06-16 DIAGNOSIS — M0609 Rheumatoid arthritis without rheumatoid factor, multiple sites: Secondary | ICD-10-CM | POA: Diagnosis not present

## 2020-06-16 DIAGNOSIS — Z79899 Other long term (current) drug therapy: Secondary | ICD-10-CM | POA: Diagnosis not present

## 2020-06-16 DIAGNOSIS — M2550 Pain in unspecified joint: Secondary | ICD-10-CM | POA: Diagnosis not present

## 2020-06-16 DIAGNOSIS — E663 Overweight: Secondary | ICD-10-CM | POA: Diagnosis not present

## 2020-06-16 DIAGNOSIS — Z6827 Body mass index (BMI) 27.0-27.9, adult: Secondary | ICD-10-CM | POA: Diagnosis not present

## 2020-08-08 ENCOUNTER — Ambulatory Visit: Payer: Medicare PPO | Attending: Internal Medicine

## 2020-08-08 DIAGNOSIS — Z23 Encounter for immunization: Secondary | ICD-10-CM

## 2020-08-08 NOTE — Progress Notes (Signed)
   Covid-19 Vaccination Clinic  Name:  Linda Soto    MRN: [id number] DOB: 1941/05/26  08/08/2020  Linda Soto was observed post Covid-19 immunization for 15 minutes without incident. She was provided with Vaccine Information Sheet and instruction to access the V-Safe system.   Linda Soto was instructed to call 911 with any severe reactions post vaccine: Marland Kitchen Difficulty breathing  . Swelling of face and throat  . A fast heartbeat  . A bad rash all over body  . Dizziness and weakness

## 2020-09-15 DIAGNOSIS — M0609 Rheumatoid arthritis without rheumatoid factor, multiple sites: Secondary | ICD-10-CM | POA: Diagnosis not present

## 2020-09-21 DIAGNOSIS — M069 Rheumatoid arthritis, unspecified: Secondary | ICD-10-CM | POA: Diagnosis not present

## 2020-09-21 DIAGNOSIS — H40013 Open angle with borderline findings, low risk, bilateral: Secondary | ICD-10-CM | POA: Diagnosis not present

## 2020-09-21 DIAGNOSIS — Z79899 Other long term (current) drug therapy: Secondary | ICD-10-CM | POA: Diagnosis not present

## 2020-09-21 DIAGNOSIS — H2513 Age-related nuclear cataract, bilateral: Secondary | ICD-10-CM | POA: Diagnosis not present

## 2020-09-21 LAB — HM DIABETES EYE EXAM

## 2020-09-22 ENCOUNTER — Encounter: Payer: Self-pay | Admitting: Internal Medicine

## 2020-09-25 DIAGNOSIS — Z1231 Encounter for screening mammogram for malignant neoplasm of breast: Secondary | ICD-10-CM | POA: Diagnosis not present

## 2020-10-24 ENCOUNTER — Other Ambulatory Visit: Payer: Self-pay

## 2020-10-25 ENCOUNTER — Ambulatory Visit (INDEPENDENT_AMBULATORY_CARE_PROVIDER_SITE_OTHER): Payer: Medicare PPO | Admitting: Internal Medicine

## 2020-10-25 ENCOUNTER — Encounter: Payer: Self-pay | Admitting: Internal Medicine

## 2020-10-25 VITALS — BP 110/70 | HR 86 | Temp 98.3°F | Ht 66.0 in | Wt 176.0 lb

## 2020-10-25 DIAGNOSIS — R739 Hyperglycemia, unspecified: Secondary | ICD-10-CM

## 2020-10-25 DIAGNOSIS — Z1159 Encounter for screening for other viral diseases: Secondary | ICD-10-CM

## 2020-10-25 DIAGNOSIS — Z0000 Encounter for general adult medical examination without abnormal findings: Secondary | ICD-10-CM | POA: Diagnosis not present

## 2020-10-25 NOTE — Patient Instructions (Signed)
Please continue all other medications as before, and refills have been done if requested.  Please have the pharmacy call with any other refills you may need.  Please continue your efforts at being more active, low cholesterol diet, and weight control.  You are otherwise up to date with prevention measures today.  Please keep your appointments with your specialists as you may have planned  Please go to the LAB at the blood drawing area for the tests to be done - at the [facility] lab at your convenience  You will be contacted by phone if any changes need to be made immediately.  Otherwise, you will receive a letter about your results with an explanation, but please check with MyChart first.  Please remember to sign up for MyChart if you have not done so, as this will be important to you in the future with finding out test results, communicating by private email, and scheduling acute appointments online when needed.  Please make an Appointment to return for your 1 year visit, or sooner if needed 

## 2020-10-25 NOTE — Progress Notes (Addendum)
Subjective:    Patient ID: Linda Soto, female    DOB: [birth year], 79 y.o.   MRN: [id number]  HPI  Here for wellness and f/u;  Overall doing ok;  Pt denies Chest pain, worsening SOB, DOE, wheezing, orthopnea, PND, worsening LE edema, palpitations, dizziness or syncope.  Pt denies neurological change such as new headache, facial or extremity weakness.  Pt denies polydipsia, polyuria, or low sugar symptoms. Pt states overall good compliance with treatment and medications, good tolerability, and has been trying to follow appropriate diet.  Pt denies worsening depressive symptoms, suicidal ideation or panic. No fever, night sweats, wt loss, loss of appetite, or other constitutional symptoms.  Pt states good ability with ADL's, has low fall risk, home safety reviewed and adequate, no other significant changes in hearing or vision, and only occasionally active with exercise. Wt Readings from Last 3 Encounters:  10/25/20 176 lb (79.8 kg)  10/25/19 167 lb (75.8 kg)  10/23/18 165 lb (74.8 kg)  also incidentally accidentally tripped and fell to right side yesterday with tender erythema swelling right periorbital, without fever, chills, other HA.   Past Medical History:  Diagnosis Date  . Allergic rhinitis   . Dense breasts    f/u mammo rec 2013 because of same  . Diverticular disease of left colon   . Menopausal symptom 1997  . Osteopenia    at femur, follows DEXA with gyn q 51yrs  . RA (rheumatoid arthritis) ([facility])    Past Surgical History:  Procedure Laterality Date  . NO PAST SURGERIES      reports that she has quit smoking. Her smoking use included cigarettes. She has never used smokeless tobacco. She reports current alcohol use. She reports that she does not use drugs. family history includes Arthritis in her mother; Breast cancer in her maternal aunt; Lung cancer in her maternal aunt; Myasthenia gravis (age of onset: 31) in her mother; Osteoporosis in her mother; Prostate cancer (age of  onset: 45) in her father; Uterine cancer in her maternal aunt. No Known Allergies Current Outpatient Medications on File Prior to Visit  Medication Sig Dispense Refill  . calcium citrate-vitamin D (CITRACAL+D) 315-200 MG-UNIT tablet Take 1 tablet by mouth 2 (two) times daily.    . folic acid (FOLVITE) 1 MG tablet Take 1 mg by mouth daily.  0  . hydroxychloroquine (PLAQUENIL) 200 MG tablet Take 200 mg by mouth 2 (two) times daily.    . methotrexate (RHEUMATREX) 2.5 MG tablet 12.5 mg 2 (two) times a week.  0  . Multiple Vitamins-Minerals (CENTRUM ADULTS PO) Take by mouth.     No current facility-administered medications on file prior to visit.   Review of Systems All otherwise neg per pt    Objective:   Physical Exam BP 110/70 (BP Location: Left Arm, Patient Position: Sitting, Cuff Size: Large)   Pulse 86   Temp 98.3 F (36.8 C) (Oral)   Ht 5\' 6"  (1.676 m)   Wt 176 lb (79.8 kg)   SpO2 97%   BMI 28.41 kg/m  VS noted,  Constitutional: Pt appears in NAD HENT: Head: NCAT.  Right Ear: External ear normal. Mild right periorbital sweling bruise Left Ear: External ear normal.  Eyes: . Pupils are equal, round, and reactive to light. Conjunctivae and EOM are normal Nose: without d/c or deformity Neck: Neck supple. Gross normal ROM Cardiovascular: Normal rate and regular rhythm.   Pulmonary/Chest: Effort normal and breath sounds without rales or wheezing.  Abd:  Soft, NT, ND, + BS, no organomegaly Neurological: Pt is alert. At baseline orientation, motor grossly intact Skin: Skin is warm. No rashes, other new lesions, no LE edema Psychiatric: Pt behavior is normal without agitation  All otherwise neg per pt Lab Results  Component Value Date   WBC 5.4 10/26/2020   HGB 14.4 10/26/2020   HCT 42.1 10/26/2020   PLT 227.0 10/26/2020   GLUCOSE 90 10/26/2020   CHOL 155 10/26/2020   TRIG 48.0 10/26/2020   HDL 78.70 10/26/2020   LDLCALC 67 10/26/2020   ALT 30 10/26/2020   AST 24  10/26/2020   NA 139 10/26/2020   K 3.6 10/26/2020   CL 105 10/26/2020   CREATININE 0.79 10/26/2020   BUN 11 10/26/2020   CO2 27 10/26/2020   TSH 6.42 (H) 10/26/2020   HGBA1C 5.5 10/26/2020      Assessment & Plan:

## 2020-10-26 ENCOUNTER — Encounter: Payer: Self-pay | Admitting: Internal Medicine

## 2020-10-26 ENCOUNTER — Other Ambulatory Visit (INDEPENDENT_AMBULATORY_CARE_PROVIDER_SITE_OTHER): Payer: Medicare PPO

## 2020-10-26 DIAGNOSIS — Z1159 Encounter for screening for other viral diseases: Secondary | ICD-10-CM

## 2020-10-26 DIAGNOSIS — R739 Hyperglycemia, unspecified: Secondary | ICD-10-CM

## 2020-10-26 DIAGNOSIS — Z0000 Encounter for general adult medical examination without abnormal findings: Secondary | ICD-10-CM

## 2020-10-26 LAB — HEPATIC FUNCTION PANEL
ALT: 30 U/L (ref 0–35)
AST: 24 U/L (ref 0–37)
Albumin: 4.1 g/dL (ref 3.5–5.2)
Alkaline Phosphatase: 78 U/L (ref 39–117)
Bilirubin, Direct: 0.2 mg/dL (ref 0.0–0.3)
Total Bilirubin: 0.8 mg/dL (ref 0.2–1.2)
Total Protein: 7.1 g/dL (ref 6.0–8.3)

## 2020-10-26 LAB — URINALYSIS, ROUTINE W REFLEX MICROSCOPIC
Bilirubin Urine: NEGATIVE
Hgb urine dipstick: NEGATIVE
Ketones, ur: NEGATIVE
Nitrite: NEGATIVE
Specific Gravity, Urine: >=1.03 — AB (ref 1.000–1.030)
Total Protein, Urine: NEGATIVE
Urine Glucose: NEGATIVE
Urobilinogen, UA: 0.2 (ref 0.0–1.0)
pH: 6 (ref 5.0–8.0)

## 2020-10-26 LAB — HEMOGLOBIN A1C: Hgb A1c MFr Bld: 5.5 % (ref 4.6–6.5)

## 2020-10-26 LAB — CBC WITH DIFFERENTIAL/PLATELET
Basophils Absolute: 0 10*3/uL (ref 0.0–0.1)
Basophils Relative: 0.6 % (ref 0.0–3.0)
Eosinophils Absolute: 0.2 10*3/uL (ref 0.0–0.7)
Eosinophils Relative: 3.9 % (ref 0.0–5.0)
HCT: 42.1 % (ref 36.0–46.0)
Hemoglobin: 14.4 g/dL (ref 12.0–15.0)
Lymphocytes Relative: 26.5 % (ref 12.0–46.0)
Lymphs Abs: 1.4 10*3/uL (ref 0.7–4.0)
MCHC: 34.2 g/dL (ref 30.0–36.0)
MCV: 97.4 fl (ref 78.0–100.0)
Monocytes Absolute: 0.4 10*3/uL (ref 0.1–1.0)
Monocytes Relative: 8 % (ref 3.0–12.0)
Neutro Abs: 3.3 10*3/uL (ref 1.4–7.7)
Neutrophils Relative %: 61 % (ref 43.0–77.0)
Platelets: 227 10*3/uL (ref 150.0–400.0)
RBC: 4.32 Mil/uL (ref 3.87–5.11)
RDW: 15.9 % — ABNORMAL HIGH (ref 11.5–15.5)
WBC: 5.4 10*3/uL (ref 4.0–10.5)

## 2020-10-26 LAB — LIPID PANEL
Cholesterol: 155 mg/dL (ref 0–200)
HDL: 78.7 mg/dL (ref >39.00)
LDL Cholesterol: 67 mg/dL (ref 0–99)
NonHDL: 76.72
Total CHOL/HDL Ratio: 2
Triglycerides: 48 mg/dL (ref 0.0–149.0)
VLDL: 9.6 mg/dL (ref 0.0–40.0)

## 2020-10-26 LAB — BASIC METABOLIC PANEL
BUN: 11 mg/dL (ref 6–23)
CO2: 27 mEq/L (ref 19–32)
Calcium: 8.9 mg/dL (ref 8.4–10.5)
Chloride: 105 mEq/L (ref 96–112)
Creatinine, Ser: 0.79 mg/dL (ref 0.40–1.20)
GFR: 72.17 mL/min (ref >60.00)
Glucose, Bld: 90 mg/dL (ref 70–99)
Potassium: 3.6 mEq/L (ref 3.5–5.1)
Sodium: 139 mEq/L (ref 135–145)

## 2020-10-26 LAB — TSH: TSH: 6.42 u[IU]/mL — ABNORMAL HIGH (ref 0.35–4.50)

## 2020-10-27 LAB — HEPATITIS C ANTIBODY
Hepatitis C Ab: NONREACTIVE
SIGNAL TO CUT-OFF: 0.01 (ref <1.00)

## 2020-10-29 ENCOUNTER — Encounter: Payer: Self-pay | Admitting: Internal Medicine

## 2020-10-29 NOTE — Assessment & Plan Note (Signed)

## 2020-10-29 NOTE — Assessment & Plan Note (Signed)
stable overall by history and exam, recent data reviewed with pt, and pt to continue medical treatment as before,  to f/u any worsening symptoms or concerns  

## 2020-11-15 DIAGNOSIS — Z124 Encounter for screening for malignant neoplasm of cervix: Secondary | ICD-10-CM | POA: Diagnosis not present

## 2020-11-15 DIAGNOSIS — Z01419 Encounter for gynecological examination (general) (routine) without abnormal findings: Secondary | ICD-10-CM | POA: Diagnosis not present

## 2020-12-15 DIAGNOSIS — M0609 Rheumatoid arthritis without rheumatoid factor, multiple sites: Secondary | ICD-10-CM | POA: Diagnosis not present

## 2020-12-15 DIAGNOSIS — E663 Overweight: Secondary | ICD-10-CM | POA: Diagnosis not present

## 2020-12-15 DIAGNOSIS — Z6827 Body mass index (BMI) 27.0-27.9, adult: Secondary | ICD-10-CM | POA: Diagnosis not present

## 2020-12-15 DIAGNOSIS — M2550 Pain in unspecified joint: Secondary | ICD-10-CM | POA: Diagnosis not present

## 2020-12-15 DIAGNOSIS — M150 Primary generalized (osteo)arthritis: Secondary | ICD-10-CM | POA: Diagnosis not present

## 2020-12-15 DIAGNOSIS — Z79899 Other long term (current) drug therapy: Secondary | ICD-10-CM | POA: Diagnosis not present

## 2021-02-07 ENCOUNTER — Other Ambulatory Visit: Payer: Self-pay

## 2021-02-07 ENCOUNTER — Ambulatory Visit (INDEPENDENT_AMBULATORY_CARE_PROVIDER_SITE_OTHER): Payer: Medicare PPO

## 2021-02-07 VITALS — BP 118/70 | HR 85 | Temp 97.8°F | Ht 66.0 in | Wt 178.2 lb

## 2021-02-07 DIAGNOSIS — Z0000 Encounter for general adult medical examination without abnormal findings: Secondary | ICD-10-CM | POA: Diagnosis not present

## 2021-02-07 NOTE — Progress Notes (Signed)
Subjective:   Linda Soto is a 80 y.o. female who presents for Medicare Annual (Subsequent) preventive examination.  Review of Systems    No ROS. Medicare Wellness Visit. Additional risk factors are reflected in social history. Cardiac Risk Factors include: advanced age (>58men, >47 women)  Sleep Patterns: No sleep issues, feels rested on waking and sleeps 7 hours nightly. Home Safety/Smoke Alarms: Feels safe in home; uses home alarm. Smoke alarms in place. Living environment: 1-story home; Lives alone; no needs for DME; good support system. Seat Belt Safety/Bike Helmet: Wears seat belt.    Objective:    Today's Vitals   02/07/21 0945  BP: 118/70  Pulse: 85  Temp: 97.8 F (36.6 C)  SpO2: 98%  Weight: 178 lb 3.2 oz (80.8 kg)  Height: 5\' 6"  (1.676 m)   Body mass index is 28.76 kg/m.  Advanced Directives 02/07/2021 10/11/2019 09/29/2017  Does Patient Have a Medical Advance Directive? Yes Yes Yes  Type of 10/01/2017 of Estate agent Power of [place];Living will Healthcare Power of [place];Living will  Copy of Healthcare Power of Attorney in Chart? No - copy requested No - copy requested No - copy requested    Current Medications (verified) Outpatient Encounter Medications as of 02/07/2021  Medication Sig  . calcium citrate-vitamin D (CITRACAL+D) 315-200 MG-UNIT tablet Take 1 tablet by mouth 2 (two) times daily.  . folic acid (FOLVITE) 1 MG tablet Take 1 mg by mouth daily.  . hydroxychloroquine (PLAQUENIL) 200 MG tablet Take 200 mg by mouth 2 (two) times daily.  . methotrexate (RHEUMATREX) 2.5 MG tablet 12.5 mg 2 (two) times a week.  . Multiple Vitamins-Minerals (CENTRUM ADULTS PO) Take by mouth.   No facility-administered encounter medications on file as of 02/07/2021.    Allergies (verified) Patient has no known allergies.   History: Past Medical History:  Diagnosis Date  . Allergic rhinitis   . Dense breasts    f/u mammo rec  2013 because of same  . Diverticular disease of left colon   . Menopausal symptom 1997  . Osteopenia    at femur, follows DEXA with gyn q 69yrs  . RA (rheumatoid arthritis) (HCC)    Past Surgical History:  Procedure Laterality Date  . NO PAST SURGERIES     Family History  Problem Relation Age of Onset  . Prostate cancer Father 10  . Arthritis Mother   . Osteoporosis Mother        vertebral compression fx  . Myasthenia gravis Mother 80  . Lung cancer Maternal Aunt   . Breast cancer Maternal Aunt   . Uterine cancer Maternal Aunt    Social History   Socioeconomic History  . Marital status: Divorced    Spouse name: Not on file  . Number of children: Not on file  . Years of education: Not on file  . Highest education level: Not on file  Occupational History  . Occupation: retired  Tobacco Use  . Smoking status: Former Smoker    Types: Cigarettes  . Smokeless tobacco: Never Used  . Tobacco comment: quit in 1975  Vaping Use  . Vaping Use: Never used  Substance and Sexual Activity  . Alcohol use: Yes    Alcohol/week: 0.0 standard drinks    Comment: occassional   . Drug use: No  . Sexual activity: Never  Other Topics Concern  . Not on file  Social History Narrative   divorced, lives alone - retired from 75  in 2011   Social Determinants of Health   Financial Resource Strain: Low Risk   . Difficulty of Paying Living Expenses: Not hard at all  Food Insecurity: No Food Insecurity  . Worried About Programme researcher, broadcasting/film/video in the Last Year: Never true  . Ran Out of Food in the Last Year: Never true  Transportation Needs: No Transportation Needs  . Lack of Transportation (Medical): No  . Lack of Transportation (Non-Medical): No  Physical Activity: Sufficiently Active  . Days of Exercise per Week: 7 days  . Minutes of Exercise per Session: 60 min  Stress: No Stress Concern Present  . Feeling of Stress : Not at all  Social Connections: Moderately Integrated   . Frequency of Communication with Friends and Family: More than three times a week  . Frequency of Social Gatherings with Friends and Family: More than three times a week  . Attends Religious Services: More than 4 times per year  . Active Member of Clubs or Organizations: No  . Attends Banker Meetings: More than 4 times per year  . Marital Status: Divorced    Tobacco Counseling Counseling given: Not Answered Comment: quit in 1975   Clinical Intake:  Pre-visit preparation completed: Yes  Pain : No/denies pain     BMI - recorded: 28.76 Nutritional Status: BMI 25 -29 Overweight Nutritional Risks: None Diabetes: No  How often do you need to have someone help you when you read instructions, pamphlets, or other written materials from your doctor or pharmacy?: 1 - Never What is the last grade level you completed in school?: Graduate Degree  Diabetic? no  Interpreter Needed?: No  Information entered by :: Susie Cassette, LPN   Activities of Daily Living In your present state of health, do you have any difficulty performing the following activities: 02/07/2021 10/25/2020  Hearing? N N  Vision? N N  Difficulty concentrating or making decisions? N N  Walking or climbing stairs? N N  Dressing or bathing? N N  Doing errands, shopping? N N  Preparing Food and eating ? N -  Using the Toilet? N -  In the past six months, have you accidently leaked urine? N -  Do you have problems with loss of bowel control? N -  Managing your Medications? N -  Managing your Finances? N -  Housekeeping or managing your Housekeeping? N -  Some recent data might be hidden    Patient Care Team: Corwin Levins, MD as PCP - General (Internal Medicine) Pennie Rushing, Maris Berger, MD (Inactive) (Obstetrics and Gynecology) Mateo Flow, MD (Ophthalmology)  Indicate any recent Medical Services you may have received from other than [facility] providers in the past year (date may be  approximate).     Assessment:   This is a routine wellness examination for Linda Soto.  Hearing/Vision screen No exam data present  Dietary issues and exercise activities discussed: Current Exercise Habits: Structured exercise class;Home exercise routine, Type of exercise: walking;treadmill;stretching;strength training/weights;Other - see comments (hiking trails, pickle ball, gardening), Time (Minutes): 60, Frequency (Times/Week): 7, Weekly Exercise (Minutes/Week): 420, Intensity: Moderate  Goals    . Patient Stated     To maintain my current health status by continuing to eat healthy, stay physically active and socially active.    . Stay healthy and active     Continue to exercise, eat healthy, hike, garden and enjoy life      Depression Screen [facility] 2/9 Scores 02/07/2021 10/25/2020 10/25/2020 10/25/2019 10/11/2019 10/23/2018 09/29/2017  PHQ - 2 Score 0 0 0 1 1 0 0  PHQ- 9 Score - - - - - - 1    Fall Risk Fall Risk  02/07/2021 10/25/2020 10/25/2020 10/25/2019 10/11/2019  Falls in the past year? 0 1 1 0 0  Number falls in past yr: 0 0 0 - 0  Injury with Fall? 0 0 1 - 0  Risk for fall due to : No Fall Risks - No Fall Risks;History of fall(s) - -  Follow up Falls evaluation completed - Falls evaluation completed - -    FALL RISK PREVENTION PERTAINING TO THE HOME:  Any stairs in or around the home? No  If so, are there any without handrails? No  Home free of loose throw rugs in walkways, pet beds, electrical cords, etc? Yes  Adequate lighting in your home to reduce risk of falls? Yes   ASSISTIVE DEVICES UTILIZED TO PREVENT FALLS:  Life alert? No  Use of a cane, walker or w/c? No  Grab bars in the bathroom? No  Shower chair or bench in shower? No  Elevated toilet seat or a handicapped toilet? No   TIMED UP AND GO:  Was the test performed? No .  Length of time to ambulate 10 feet: 0 sec.   Gait steady and fast without use of assistive device  Cognitive Function: Normal  cognitive status assessed by direct observation by this Nurse Health Advisor. No abnormalities found.   MMSE - Mini Mental State Exam 09/29/2017  Orientation to time 5  Orientation to Place 5  Registration 3  Attention/ Calculation 5  Recall 3  Language- name 2 objects 2  Language- repeat 1  Language- follow 3 step command 3  Language- read & follow direction 1  Write a sentence 1  Copy design 1  Total score 30        Immunizations Immunization History  Administered Date(s) Administered  . Fluad Quad(high Dose 65+) 08/27/2019  . Influenza, High Dose Seasonal PF 09/01/2013, 09/20/2014, 09/29/2017, 09/23/2018  . Influenza,inj,Quad PF,6+ Mos 09/04/2015  . Influenza-Unspecified 10/01/2020  . PFIZER(Purple Top)SARS-COV-2 Vaccination 12/03/2019, 12/24/2019, 08/08/2020  . Pneumococcal Conjugate-13 09/04/2015  . Pneumococcal-Unspecified 11/11/2010  . Tetanus 09/01/2013  . Zoster 10/27/2015  . Zoster Recombinat (Shingrix) 10/08/2017, 11/28/2017    TDAP status: Due, Education has been provided regarding the importance of this vaccine. Advised may receive this vaccine at local pharmacy or Health Dept. Aware to provide a copy of the vaccination record if obtained from local pharmacy or Health Dept. Verbalized acceptance and understanding.  Flu Vaccine status: Up to date  Pneumococcal vaccine status: Up to date  Covid-19 vaccine status: Completed vaccines  Qualifies for Shingles Vaccine? Yes   Zostavax completed Yes   Shingrix Completed?: Yes  Screening Tests Health Maintenance  Topic Date Due  . TETANUS/TDAP  09/02/2023  . INFLUENZA VACCINE  Completed  . DEXA SCAN  Completed  . COVID-19 Vaccine  Completed  . Hepatitis C Screening  Completed  . PNA vac Low Risk Adult  Completed  . HPV VACCINES  Aged Out    Health Maintenance  There are no preventive care reminders to display for this patient.  Colorectal cancer screening: No longer required.   Mammogram status:  Completed 09/29/2020. Repeat every year  Bone Density status: Completed 09/21/2019. Results reflect: Bone density results: OSTEOPENIA. Repeat every 2 years.  Lung Cancer Screening: (Low Dose CT Chest recommended if Age 49-80 years, 30 pack-year currently smoking OR have quit w/in 15years.)  does not qualify.   Lung Cancer Screening Referral: no  Additional Screening:  Hepatitis C Screening: does qualify; Completed yes  Vision Screening: Recommended annual ophthalmology exams for early detection of glaucoma and other disorders of the eye. Is the patient up to date with their annual eye exam?  Yes  Who is the provider or what is the name of the office in which the patient attends annual eye exams? Wynell Balloon, MD. If pt is not established with a provider, would they like to be referred to a provider to establish care? No .   Dental Screening: Recommended annual dental exams for proper oral hygiene  Community Resource Referral / Chronic Care Management: CRR required this visit?  No   CCM required this visit?  No      Plan:     I have personally reviewed and noted the following in the patient's chart:   . Medical and social history . Use of alcohol, tobacco or illicit drugs  . Current medications and supplements . Functional ability and status . Nutritional status . Physical activity . Advanced directives . List of other physicians . Hospitalizations, surgeries, and ER visits in previous 12 months . Vitals . Screenings to include cognitive, depression, and falls . Referrals and appointments  In addition, I have reviewed and discussed with patient certain preventive protocols, quality metrics, and best practice recommendations. A written personalized care plan for preventive services as well as general preventive health recommendations were provided to patient.     Mickeal Needy, LPN   [id number]   Nurse Notes:  Medications reviewed with patient; no opioid use  noted.

## 2021-02-07 NOTE — Patient Instructions (Signed)
Linda Soto , Thank you for taking time to come for your Medicare Wellness Visit. I appreciate your ongoing commitment to your health goals. Please review the following plan we discussed and let me know if I can assist you in the future.   Screening recommendations/referrals: Colonoscopy: Cologuard due every 3 years based on age Mammogram: Done at OB/GYN Bone Density: Done at Ob/GYN Recommended yearly ophthalmology/optometry visit for glaucoma screening and checkup Recommended yearly dental visit for hygiene and checkup  Vaccinations: Influenza vaccine: 10/01/2020 Pneumococcal vaccine: 11/11/2010, 09/04/2015 Tdap vaccine: never done Shingles vaccine: 10/08/2017, 11/28/2017   Covid-19: 12/03/2019, 12/24/2019, 08/08/2020  Advanced directives: Please bring a copy of your health care power of attorney and living will to the office at your convenience.  Conditions/risks identified: Yes; Reviewed health maintenance screenings with patient today and relevant education, vaccines, and/or referrals were provided. Please continue to do your personal lifestyle choices by: daily care of teeth and gums, regular physical activity (goal should be 5 days a week for 30 minutes), eat a healthy diet, avoid tobacco and drug use, limiting any alcohol intake, taking a low-dose aspirin (if not allergic or have been advised by your provider otherwise) and taking vitamins and minerals as recommended by your provider. Continue doing brain stimulating activities (puzzles, reading, adult coloring books, staying active) to keep memory sharp. Continue to eat heart healthy diet (full of fruits, vegetables, whole grains, lean protein, water--limit salt, fat, and sugar intake) and increase physical activity as tolerated.  Next appointment: Please schedule your next Medicare Wellness Visit with your Nurse Health Advisor in 1 year by calling [phone].  Preventive Care 24 Years and Older, Female Preventive care refers to lifestyle  choices and visits with your health care provider that can promote health and wellness. What does preventive care include?  A yearly physical exam. This is also called an annual well check.  Dental exams once or twice a year.  Routine eye exams. Ask your health care provider how often you should have your eyes checked.  Personal lifestyle choices, including:  Daily care of your teeth and gums.  Regular physical activity.  Eating a healthy diet.  Avoiding tobacco and drug use.  Limiting alcohol use.  Practicing safe sex.  Taking low-dose aspirin every day.  Taking vitamin and mineral supplements as recommended by your health care provider. What happens during an annual well check? The services and screenings done by your health care provider during your annual well check will depend on your age, overall health, lifestyle risk factors, and family history of disease. Counseling  Your health care provider may ask you questions about your:  Alcohol use.  Tobacco use.  Drug use.  Emotional well-being.  Home and relationship well-being.  Sexual activity.  Eating habits.  History of falls.  Memory and ability to understand (cognition).  Work and work Astronomer.  Reproductive health. Screening  You may have the following tests or measurements:  Height, weight, and BMI.  Blood pressure.  Lipid and cholesterol levels. These may be checked every 5 years, or more frequently if you are over 53 years old.  Skin check.  Lung cancer screening. You may have this screening every year starting at age 59 if you have a 30-pack-year history of smoking and currently smoke or have quit within the past 15 years.  Fecal occult blood test (FOBT) of the stool. You may have this test every year starting at age 110.  Flexible sigmoidoscopy or colonoscopy. You may have a sigmoidoscopy  every 5 years or a colonoscopy every 10 years starting at age 5.  Hepatitis C blood  test.  Hepatitis B blood test.  Sexually transmitted disease (STD) testing.  Diabetes screening. This is done by checking your blood sugar (glucose) after you have not eaten for a while (fasting). You may have this done every 1-3 years.  Bone density scan. This is done to screen for osteoporosis. You may have this done starting at age 55.  Mammogram. This may be done every 1-2 years. Talk to your health care provider about how often you should have regular mammograms. Talk with your health care provider about your test results, treatment options, and if necessary, the need for more tests. Vaccines  Your health care provider may recommend certain vaccines, such as:  Influenza vaccine. This is recommended every year.  Tetanus, diphtheria, and acellular pertussis (Tdap, Td) vaccine. You may need a Td booster every 10 years.  Zoster vaccine. You may need this after age 72.  Pneumococcal 13-valent conjugate (PCV13) vaccine. One dose is recommended after age 4.  Pneumococcal polysaccharide (PPSV23) vaccine. One dose is recommended after age 57. Talk to your health care provider about which screenings and vaccines you need and how often you need them. This information is not intended to replace advice given to you by your health care provider. Make sure you discuss any questions you have with your health care provider. Document Released: 11/24/2015 Document Revised: 07/17/2016 Document Reviewed: 08/29/2015 Elsevier Interactive Patient Education  2017 [place] Prevention in the Home Falls can cause injuries. They can happen to people of all ages. There are many things you can do to make your home safe and to help prevent falls. What can I do on the outside of my home?  Regularly fix the edges of walkways and driveways and fix any cracks.  Remove anything that might make you trip as you walk through a door, such as a raised step or threshold.  Trim any bushes or trees on the  path to your home.  Use bright outdoor lighting.  Clear any walking paths of anything that might make someone trip, such as rocks or tools.  Regularly check to see if handrails are loose or broken. Make sure that both sides of any steps have handrails.  Any raised decks and porches should have guardrails on the edges.  Have any leaves, snow, or ice cleared regularly.  Use sand or salt on walking paths during winter.  Clean up any spills in your garage right away. This includes oil or grease spills. What can I do in the bathroom?  Use night lights.  Install grab bars by the toilet and in the tub and shower. Do not use towel bars as grab bars.  Use non-skid mats or decals in the tub or shower.  If you need to sit down in the shower, use a plastic, non-slip stool.  Keep the floor dry. Clean up any water that spills on the floor as soon as it happens.  Remove soap buildup in the tub or shower regularly.  Attach bath mats securely with double-sided non-slip rug tape.  Do not have throw rugs and other things on the floor that can make you trip. What can I do in the bedroom?  Use night lights.  Make sure that you have a light by your bed that is easy to reach.  Do not use any sheets or blankets that are too big for your bed. They should  not hang down onto the floor.  Have a firm chair that has side arms. You can use this for support while you get dressed.  Do not have throw rugs and other things on the floor that can make you trip. What can I do in the kitchen?  Clean up any spills right away.  Avoid walking on wet floors.  Keep items that you use a lot in easy-to-reach places.  If you need to reach something above you, use a strong step stool that has a grab bar.  Keep electrical cords out of the way.  Do not use floor polish or wax that makes floors slippery. If you must use wax, use non-skid floor wax.  Do not have throw rugs and other things on the floor that can  make you trip. What can I do with my stairs?  Do not leave any items on the stairs.  Make sure that there are handrails on both sides of the stairs and use them. Fix handrails that are broken or loose. Make sure that handrails are as long as the stairways.  Check any carpeting to make sure that it is firmly attached to the stairs. Fix any carpet that is loose or worn.  Avoid having throw rugs at the top or bottom of the stairs. If you do have throw rugs, attach them to the floor with carpet tape.  Make sure that you have a light switch at the top of the stairs and the bottom of the stairs. If you do not have them, ask someone to add them for you. What else can I do to help prevent falls?  Wear shoes that:  Do not have high heels.  Have rubber bottoms.  Are comfortable and fit you well.  Are closed at the toe. Do not wear sandals.  If you use a stepladder:  Make sure that it is fully opened. Do not climb a closed stepladder.  Make sure that both sides of the stepladder are locked into place.  Ask someone to hold it for you, if possible.  Clearly mark and make sure that you can see:  Any grab bars or handrails.  First and last steps.  Where the edge of each step is.  Use tools that help you move around (mobility aids) if they are needed. These include:  Canes.  Walkers.  Scooters.  Crutches.  Turn on the lights when you go into a dark area. Replace any light bulbs as soon as they burn out.  Set up your furniture so you have a clear path. Avoid moving your furniture around.  If any of your floors are uneven, fix them.  If there are any pets around you, be aware of where they are.  Review your medicines with your doctor. Some medicines can make you feel dizzy. This can increase your chance of falling. Ask your doctor what other things that you can do to help prevent falls. This information is not intended to replace advice given to you by your health care  provider. Make sure you discuss any questions you have with your health care provider. Document Released: 08/24/2009 Document Revised: 04/04/2016 Document Reviewed: 12/02/2014 Elsevier Interactive Patient Education  2017 [employer].

## 2021-03-14 DIAGNOSIS — M0609 Rheumatoid arthritis without rheumatoid factor, multiple sites: Secondary | ICD-10-CM | POA: Diagnosis not present

## 2021-03-26 NOTE — Progress Notes (Signed)
Virtual Visit via Video Note  I connected with Linda Soto on 03/26/21 at  8:10 AM EDT by a video enabled telemedicine application and verified that I am speaking with the correct person using two identifiers.   I discussed the limitations of evaluation and management by telemedicine and the availability of in person appointments. The patient expressed understanding and agreed to proceed.  Present for the visit:  Myself, Dr Cheryll Cockayne, Corey Skains.  The patient is currently at home and I am in the office.    No referring provider.    History of Present Illness: This is an acute visit for covid.  Her symptoms started yesterday morning.  She took a home covid test and it was positive.  She states sore throat, congestion, dry cough and headaches. She has had a max temp of 102.   She has RA and is on methotrexate.  She has taken Tylenol.    She has had the first booster.    Review of Systems  Constitutional: Positive for fever (102). Negative for malaise/fatigue.  HENT: Positive for congestion (no PND) and sore throat. Negative for ear pain and sinus pain.        No change in taste or smell  Respiratory: Positive for cough. Negative for sputum production, shortness of breath and wheezing.   Gastrointestinal: Negative for diarrhea and nausea.  Musculoskeletal: Negative for myalgias.  Neurological: Positive for headaches.      Social History   Socioeconomic History  . Marital status: Divorced    Spouse name: Not on file  . Number of children: Not on file  . Years of education: Not on file  . Highest education level: Not on file  Occupational History  . Occupation: retired  Tobacco Use  . Smoking status: Former Smoker    Types: Cigarettes  . Smokeless tobacco: Never Used  . Tobacco comment: quit in 1975  Vaping Use  . Vaping Use: Never used  Substance and Sexual Activity  . Alcohol use: Yes    Alcohol/week: 0.0 standard drinks    Comment: occassional   . Drug  use: No  . Sexual activity: Never  Other Topics Concern  . Not on file  Social History Narrative   divorced, lives alone - retired from [employer] in 2011   Social Determinants of Health   Financial Resource Strain: Low Risk   . Difficulty of Paying Living Expenses: Not hard at all  Food Insecurity: No Food Insecurity  . Worried About Programme researcher, broadcasting/film/video in the Last Year: Never true  . Ran Out of Food in the Last Year: Never true  Transportation Needs: No Transportation Needs  . Lack of Transportation (Medical): No  . Lack of Transportation (Non-Medical): No  Physical Activity: Sufficiently Active  . Days of Exercise per Week: 7 days  . Minutes of Exercise per Session: 60 min  Stress: No Stress Concern Present  . Feeling of Stress : Not at all  Social Connections: Moderately Integrated  . Frequency of Communication with Friends and Family: More than three times a week  . Frequency of Social Gatherings with Friends and Family: More than three times a week  . Attends Religious Services: More than 4 times per year  . Active Member of Clubs or Organizations: No  . Attends Banker Meetings: More than 4 times per year  . Marital Status: Divorced     Observations/Objective: Appears well in NAD Breathing normally Skin appears warm and  dry  Assessment and Plan:  See Problem List for Assessment and Plan of chronic medical problems.   Follow Up Instructions:    I discussed the assessment and treatment plan with the patient. The patient was provided an opportunity to ask questions and all were answered. The patient agreed with the plan and demonstrated an understanding of the instructions.   The patient was advised to call back or seek an in-person evaluation if the symptoms worsen or if the condition fails to improve as anticipated.    Binnie Rail, MD

## 2021-03-27 ENCOUNTER — Telehealth (INDEPENDENT_AMBULATORY_CARE_PROVIDER_SITE_OTHER): Payer: Medicare PPO | Admitting: Internal Medicine

## 2021-03-27 ENCOUNTER — Other Ambulatory Visit: Payer: Self-pay

## 2021-03-27 ENCOUNTER — Encounter: Payer: Self-pay | Admitting: Internal Medicine

## 2021-03-27 DIAGNOSIS — U071 COVID-19: Secondary | ICD-10-CM | POA: Diagnosis not present

## 2021-03-27 MED ORDER — MOLNUPIRAVIR 200 MG PO CAPS: 800.0000 mg | ORAL_CAPSULE | Freq: Two times a day (BID) | ORAL | 0 refills | Status: AC

## 2021-03-27 MED ORDER — HYDROCODONE BIT-HOMATROP MBR 5-1.5 MG/5ML PO SOLN: 5.0000 mL | Freq: Four times a day (QID) | ORAL | 0 refills | Status: DC | PRN

## 2021-03-27 NOTE — Assessment & Plan Note (Addendum)
Acute With mild-mod symptoms Given age and diagnosis of RA on methotrexate she is at an increased risk of complications No GFR in past few months.  Start treatment with molnupiravir - 800 mg bid x 5 days Hydromet cough syrup Discussed holding methotrexate - discuss with rheumatology on how long to hold Discussed possible side effects - diarrhea, nausea and dizziness otc medications for symptoms relief Rest, fluids

## 2021-06-19 DIAGNOSIS — E663 Overweight: Secondary | ICD-10-CM | POA: Diagnosis not present

## 2021-06-19 DIAGNOSIS — Z6827 Body mass index (BMI) 27.0-27.9, adult: Secondary | ICD-10-CM | POA: Diagnosis not present

## 2021-06-19 DIAGNOSIS — M150 Primary generalized (osteo)arthritis: Secondary | ICD-10-CM | POA: Diagnosis not present

## 2021-06-19 DIAGNOSIS — M0609 Rheumatoid arthritis without rheumatoid factor, multiple sites: Secondary | ICD-10-CM | POA: Diagnosis not present

## 2021-06-19 DIAGNOSIS — M2550 Pain in unspecified joint: Secondary | ICD-10-CM | POA: Diagnosis not present

## 2021-06-19 DIAGNOSIS — Z79899 Other long term (current) drug therapy: Secondary | ICD-10-CM | POA: Diagnosis not present

## 2021-07-20 DIAGNOSIS — R7989 Other specified abnormal findings of blood chemistry: Secondary | ICD-10-CM | POA: Diagnosis not present

## 2021-09-19 DIAGNOSIS — M0609 Rheumatoid arthritis without rheumatoid factor, multiple sites: Secondary | ICD-10-CM | POA: Diagnosis not present

## 2021-09-21 DIAGNOSIS — H40013 Open angle with borderline findings, low risk, bilateral: Secondary | ICD-10-CM | POA: Diagnosis not present

## 2021-09-21 DIAGNOSIS — H2513 Age-related nuclear cataract, bilateral: Secondary | ICD-10-CM | POA: Diagnosis not present

## 2021-09-21 DIAGNOSIS — M069 Rheumatoid arthritis, unspecified: Secondary | ICD-10-CM | POA: Diagnosis not present

## 2021-09-21 DIAGNOSIS — Z79899 Other long term (current) drug therapy: Secondary | ICD-10-CM | POA: Diagnosis not present

## 2021-09-27 ENCOUNTER — Other Ambulatory Visit: Payer: Self-pay

## 2021-09-27 ENCOUNTER — Ambulatory Visit (INDEPENDENT_AMBULATORY_CARE_PROVIDER_SITE_OTHER): Payer: Medicare PPO

## 2021-09-27 DIAGNOSIS — Z23 Encounter for immunization: Secondary | ICD-10-CM | POA: Diagnosis not present

## 2021-10-26 ENCOUNTER — Other Ambulatory Visit: Payer: Self-pay

## 2021-10-26 ENCOUNTER — Encounter: Payer: Self-pay | Admitting: Internal Medicine

## 2021-10-26 ENCOUNTER — Ambulatory Visit (INDEPENDENT_AMBULATORY_CARE_PROVIDER_SITE_OTHER): Payer: Medicare PPO | Admitting: Internal Medicine

## 2021-10-26 VITALS — BP 128/72 | HR 83 | Temp 98.2°F | Ht 66.0 in | Wt 176.4 lb

## 2021-10-26 DIAGNOSIS — E559 Vitamin D deficiency, unspecified: Secondary | ICD-10-CM

## 2021-10-26 DIAGNOSIS — E038 Other specified hypothyroidism: Secondary | ICD-10-CM

## 2021-10-26 DIAGNOSIS — Z0001 Encounter for general adult medical examination with abnormal findings: Secondary | ICD-10-CM

## 2021-10-26 DIAGNOSIS — Z23 Encounter for immunization: Secondary | ICD-10-CM | POA: Diagnosis not present

## 2021-10-26 DIAGNOSIS — H9193 Unspecified hearing loss, bilateral: Secondary | ICD-10-CM

## 2021-10-26 DIAGNOSIS — M069 Rheumatoid arthritis, unspecified: Secondary | ICD-10-CM | POA: Diagnosis not present

## 2021-10-26 DIAGNOSIS — U071 COVID-19: Secondary | ICD-10-CM | POA: Diagnosis not present

## 2021-10-26 DIAGNOSIS — H6123 Impacted cerumen, bilateral: Secondary | ICD-10-CM

## 2021-10-26 DIAGNOSIS — R739 Hyperglycemia, unspecified: Secondary | ICD-10-CM

## 2021-10-26 DIAGNOSIS — H900 Conductive hearing loss, bilateral: Secondary | ICD-10-CM

## 2021-10-26 DIAGNOSIS — E538 Deficiency of other specified B group vitamins: Secondary | ICD-10-CM

## 2021-10-26 LAB — T4, FREE: Free T4: 0.86 ng/dL (ref 0.60–1.60)

## 2021-10-26 LAB — BASIC METABOLIC PANEL
BUN: 10 mg/dL (ref 6–23)
CO2: 29 mEq/L (ref 19–32)
Calcium: 9.4 mg/dL (ref 8.4–10.5)
Chloride: 105 mEq/L (ref 96–112)
Creatinine, Ser: 0.77 mg/dL (ref 0.40–1.20)
GFR: 73.91 mL/min (ref >60.00)
Glucose, Bld: 86 mg/dL (ref 70–99)
Potassium: 4.4 mEq/L (ref 3.5–5.1)
Sodium: 141 mEq/L (ref 135–145)

## 2021-10-26 LAB — URINALYSIS, ROUTINE W REFLEX MICROSCOPIC
Bilirubin Urine: NEGATIVE
Ketones, ur: NEGATIVE
Nitrite: NEGATIVE
RBC / HPF: NONE SEEN (ref 0–?)
Specific Gravity, Urine: 1.025 (ref 1.000–1.030)
Total Protein, Urine: NEGATIVE
Urine Glucose: NEGATIVE
Urobilinogen, UA: 0.2 (ref 0.0–1.0)
pH: 6 (ref 5.0–8.0)

## 2021-10-26 LAB — HEPATIC FUNCTION PANEL
ALT: 22 U/L (ref 0–35)
AST: 24 U/L (ref 0–37)
Albumin: 4.1 g/dL (ref 3.5–5.2)
Alkaline Phosphatase: 88 U/L (ref 39–117)
Bilirubin, Direct: 0.1 mg/dL (ref 0.0–0.3)
Total Bilirubin: 0.6 mg/dL (ref 0.2–1.2)
Total Protein: 7.1 g/dL (ref 6.0–8.3)

## 2021-10-26 LAB — CBC WITH DIFFERENTIAL/PLATELET
Basophils Absolute: 0 10*3/uL (ref 0.0–0.1)
Basophils Relative: 0.4 % (ref 0.0–3.0)
Eosinophils Absolute: 0.1 10*3/uL (ref 0.0–0.7)
Eosinophils Relative: 1.9 % (ref 0.0–5.0)
HCT: 42.5 % (ref 36.0–46.0)
Hemoglobin: 14.2 g/dL (ref 12.0–15.0)
Lymphocytes Relative: 15.4 % (ref 12.0–46.0)
Lymphs Abs: 1 10*3/uL (ref 0.7–4.0)
MCHC: 33.4 g/dL (ref 30.0–36.0)
MCV: 98.8 fl (ref 78.0–100.0)
Monocytes Absolute: 0.5 10*3/uL (ref 0.1–1.0)
Monocytes Relative: 7.8 % (ref 3.0–12.0)
Neutro Abs: 4.8 10*3/uL (ref 1.4–7.7)
Neutrophils Relative %: 74.5 % (ref 43.0–77.0)
Platelets: 228 10*3/uL (ref 150.0–400.0)
RBC: 4.3 Mil/uL (ref 3.87–5.11)
RDW: 15.2 % (ref 11.5–15.5)
WBC: 6.4 10*3/uL (ref 4.0–10.5)

## 2021-10-26 LAB — LIPID PANEL
Cholesterol: 159 mg/dL (ref 0–200)
HDL: 87 mg/dL (ref >39.00)
LDL Cholesterol: 63 mg/dL (ref 0–99)
NonHDL: 71.6
Total CHOL/HDL Ratio: 2
Triglycerides: 43 mg/dL (ref 0.0–149.0)
VLDL: 8.6 mg/dL (ref 0.0–40.0)

## 2021-10-26 LAB — VITAMIN D 25 HYDROXY (VIT D DEFICIENCY, FRACTURES): VITD: 35.3 ng/mL (ref 30.00–100.00)

## 2021-10-26 LAB — VITAMIN B12: Vitamin B-12: 999 pg/mL — ABNORMAL HIGH (ref 211–911)

## 2021-10-26 LAB — HEMOGLOBIN A1C: Hgb A1c MFr Bld: 5.6 % (ref 4.6–6.5)

## 2021-10-26 LAB — TSH: TSH: 4.23 u[IU]/mL (ref 0.35–5.50)

## 2021-10-26 NOTE — Progress Notes (Signed)
Patient ID: Linda Soto, female   DOB: 1941-04-22, 80 y.o.   MRN: [id number]         Chief Complaint:: wellness exam and bilateral hearing loss, low vit d, hyperglycemia       HPI:  Linda Soto is a 80 y.o. female here for wellness exam; declines covid booster, for pneumovax today, o/w up to date                        Also s/p covid infection may 2022.  Pt denies chest pain, increased sob or doe, wheezing, orthopnea, PND, increased LE swelling, palpitations, dizziness or syncope.   Pt denies polydipsia, polyuria, or new focal neuro s/s.   Pt denies fever, wt loss, night sweats, loss of appetite, or other constitutional symptoms  Does have 1-2 wks worsening bilateral hearing loss - ? Wax impactions as before, as no HA, fever, ear pain, tinnitus or vertigo.  Taking lower dose vit d 1000 u.  Cont's to follow with rheumatology but now worsening hand pain or other joint pain or swelling;       Wt Readings from Last 3 Encounters:  10/26/21 176 lb 6.4 oz (80 kg)  02/07/21 178 lb 3.2 oz (80.8 kg)  10/25/20 176 lb (79.8 kg)   BP Readings from Last 3 Encounters:  10/26/21 128/72  02/07/21 118/70  10/25/20 110/70   Immunization History  Administered Date(s) Administered   Fluad Quad(high Dose 65+) 08/27/2019, 09/27/2021   Influenza, High Dose Seasonal PF 09/01/2013, 09/20/2014, 09/29/2017, 09/23/2018   Influenza,inj,Quad PF,6+ Mos 09/04/2015   Influenza-Unspecified 10/01/2020   PFIZER(Purple Top)SARS-COV-2 Vaccination 12/03/2019, 12/24/2019, 08/08/2020, 07/26/2021   Pneumococcal Conjugate-13 09/04/2015   Pneumococcal Polysaccharide-23 10/26/2021   Pneumococcal-Unspecified 11/11/2010   Tetanus 09/01/2013   Zoster Recombinat (Shingrix) 10/08/2017, 10/08/2017, 11/28/2017, 02/25/2018   Zoster, Live 10/27/2015   There are no preventive care reminders to display for this patient.     Past Medical History:  Diagnosis Date   Allergic rhinitis    Dense breasts    f/u mammo rec 2013 because  of same   Diverticular disease of left colon    Menopausal symptom 1997   Osteopenia    at femur, follows DEXA with gyn q 6yrs   RA (rheumatoid arthritis) (HCC)    Past Surgical History:  Procedure Laterality Date   NO PAST SURGERIES      reports that she has quit smoking. Her smoking use included cigarettes. She has never used smokeless tobacco. She reports current alcohol use. She reports that she does not use drugs. family history includes Arthritis in her mother; Breast cancer in her maternal aunt; Lung cancer in her maternal aunt; Myasthenia gravis (age of onset: 5) in her mother; Osteoporosis in her mother; Prostate cancer (age of onset: 69) in her father; Uterine cancer in her maternal aunt. No Known Allergies Current Outpatient Medications on File Prior to Visit  Medication Sig Dispense Refill   calcium citrate-vitamin D (CITRACAL+D) 315-200 MG-UNIT tablet Take 1 tablet by mouth 2 (two) times daily.     folic acid (FOLVITE) 1 MG tablet Take 1 mg by mouth daily.  0   hydroxychloroquine (PLAQUENIL) 200 MG tablet Take 200 mg by mouth 2 (two) times daily.     methotrexate (RHEUMATREX) 2.5 MG tablet 12.5 mg 2 (two) times a week.  0   Multiple Vitamins-Minerals (CENTRUM ADULTS PO) Take by mouth.     No current facility-administered medications on file prior to  visit.        ROS:  All others reviewed and negative.  Objective        PE:  BP 128/72 (BP Location: Left Arm)    Pulse 83    Temp 98.2 F (36.8 C) (Oral)    Ht 5\' 6"  (1.676 m)    Wt 176 lb 6.4 oz (80 kg)    SpO2 95%    BMI 28.47 kg/m                 Constitutional: Pt appears in NAD               HENT: Head: NCAT.                Right Ear: External ear normal.                 Left Ear: External ear normal. Bilateral ear wax impactions resolved with irrigation and hearing improved               Eyes: . Pupils are equal, round, and reactive to light. Conjunctivae and EOM are normal               Nose: without d/c or  deformity               Neck: Neck supple. Gross normal ROM               Cardiovascular: Normal rate and regular rhythm.                 Pulmonary/Chest: Effort normal and breath sounds without rales or wheezing.                Abd:  Soft, NT, ND, + BS, no organomegaly               Neurological: Pt is alert. At baseline orientation, motor grossly intact               Skin: Skin is warm. No rashes, no other new lesions, LE edema - none               Psychiatric: Pt behavior is normal without agitation   Micro: none  Cardiac tracings I have personally interpreted today:  none  Pertinent Radiological findings (summarize): none   Lab Results  Component Value Date   WBC 6.4 10/26/2021   HGB 14.2 10/26/2021   HCT 42.5 10/26/2021   PLT 228.0 10/26/2021   GLUCOSE 86 10/26/2021   CHOL 159 10/26/2021   TRIG 43.0 10/26/2021   HDL 87.00 10/26/2021   LDLCALC 63 10/26/2021   ALT 22 10/26/2021   AST 24 10/26/2021   NA 141 10/26/2021   K 4.4 10/26/2021   CL 105 10/26/2021   CREATININE 0.77 10/26/2021   BUN 10 10/26/2021   CO2 29 10/26/2021   TSH 4.23 10/26/2021   HGBA1C 5.6 10/26/2021   Assessment/Plan:  Linda Soto is a 80 y.o. Black or African American [2] female with  has a past medical history of Allergic rhinitis, Dense breasts, Diverticular disease of left colon, Menopausal symptom (1997), Osteopenia, and RA (rheumatoid arthritis) (HCC).  Encounter for well adult exam with abnormal findings Age and sex appropriate education and counseling updated with regular exercise and diet Referrals for preventative services - none needed Immunizations addressed - declines covid booster, but ok for pneumovax Smoking counseling  - none needed Evidence for depression or other mood disorder - none significant Most recent labs  reviewed. I have personally reviewed and have noted: 1) the patient's medical and social history 2) The patient's current medications and supplements 3) The  patient's height, weight, and BMI have been recorded in the chart   Hyperglycemia Lab Results  Component Value Date   HGBA1C 5.6 10/26/2021   Stable, pt to continue current medical treatment  - diet   Bilateral hearing loss Resolved with irrigation bilateral wax impactions  Ceruminosis is noted.  Wax is removed by syringing and manual debridement. Instructions for home care to prevent wax buildup are given.   Vitamin D deficiency Last vitamin D Lab Results  Component Value Date   VD25OH 35.30 10/26/2021   Low, to increase oral replacement to 2000 u qd   COVID Denies long haul symptoms such as fatigue and sob,  to f/u any worsening symptoms or concerns  RA (rheumatoid arthritis) (HCC) overall stable, cont current mtx and plaquinil and f/u rheum as planned  Subclinical hypothyroidism Lab Results  Component Value Date   TSH 4.23 10/26/2021   Stable, pt to continue  - no med tx for now  Followup: Return in about 1 year (around 10/26/2022).  Oliver Barre, MD 10/28/2021 4:53 AM [facility]

## 2021-10-26 NOTE — Progress Notes (Signed)
Patient consent obtained. Irrigation with water and peroxide performed. Full view of tympanic membranes after procedure.  Patient tolerated procedure well.   

## 2021-10-26 NOTE — Patient Instructions (Signed)
You had the pneumovax pneumonia shot today  Your ears were cleared of wax today  Please continue all other medications as before, and refills have been done if requested.  Please have the pharmacy call with any other refills you may need.  Please continue your efforts at being more active, low cholesterol diet, and weight control.  You are otherwise up to date with prevention measures today.  Please keep your appointments with your specialists as you may have planned - rheumatology  Please go to the LAB at the blood drawing area for the tests to be done  You will be contacted by phone if any changes need to be made immediately.  Otherwise, you will receive a letter about your results with an explanation, but please check with MyChart first.  Please remember to sign up for MyChart if you have not done so, as this will be important to you in the future with finding out test results, communicating by private email, and scheduling acute appointments online when needed.  Please make an Appointment to return for your 1 year visit, or sooner if needed

## 2021-10-27 ENCOUNTER — Encounter: Payer: Self-pay | Admitting: Internal Medicine

## 2021-10-28 ENCOUNTER — Encounter: Payer: Self-pay | Admitting: Internal Medicine

## 2021-10-28 DIAGNOSIS — E038 Other specified hypothyroidism: Secondary | ICD-10-CM | POA: Insufficient documentation

## 2021-10-28 DIAGNOSIS — E559 Vitamin D deficiency, unspecified: Secondary | ICD-10-CM | POA: Insufficient documentation

## 2021-10-28 NOTE — Assessment & Plan Note (Signed)
overall stable, cont current mtx and plaquinil and f/u rheum as planned

## 2021-10-28 NOTE — Assessment & Plan Note (Signed)
Lab Results  Component Value Date   TSH 4.23 10/26/2021   Stable, pt to continue  - no med tx for now

## 2021-10-28 NOTE — Assessment & Plan Note (Signed)
Resolved with irrigation bilateral wax impactions  Ceruminosis is noted.  Wax is removed by syringing and manual debridement. Instructions for home care to prevent wax buildup are given.

## 2021-10-28 NOTE — Assessment & Plan Note (Signed)
Lab Results  Component Value Date   HGBA1C 5.6 10/26/2021   Stable, pt to continue current medical treatment  - diet

## 2021-10-28 NOTE — Assessment & Plan Note (Signed)
Last vitamin D Lab Results  Component Value Date   VD25OH 35.30 10/26/2021   Low, to increase oral replacement to 2000 u qd

## 2021-10-28 NOTE — Assessment & Plan Note (Signed)
Denies long haul symptoms such as fatigue and sob,  to f/u any worsening symptoms or concerns

## 2021-10-28 NOTE — Assessment & Plan Note (Addendum)
Age and sex appropriate education and counseling updated with regular exercise and diet Referrals for preventative services - none needed Immunizations addressed - declines covid booster, but ok for pneumovax Smoking counseling  - none needed Evidence for depression or other mood disorder - none significant Most recent labs reviewed. I have personally reviewed and have noted: 1) the patient's medical and social history 2) The patient's current medications and supplements 3) The patient's height, weight, and BMI have been recorded in the chart

## 2021-11-20 DIAGNOSIS — Z01419 Encounter for gynecological examination (general) (routine) without abnormal findings: Secondary | ICD-10-CM | POA: Diagnosis not present

## 2021-11-20 DIAGNOSIS — Z1231 Encounter for screening mammogram for malignant neoplasm of breast: Secondary | ICD-10-CM | POA: Diagnosis not present

## 2021-12-20 DIAGNOSIS — E663 Overweight: Secondary | ICD-10-CM | POA: Diagnosis not present

## 2021-12-20 DIAGNOSIS — Z79899 Other long term (current) drug therapy: Secondary | ICD-10-CM | POA: Diagnosis not present

## 2021-12-20 DIAGNOSIS — M0609 Rheumatoid arthritis without rheumatoid factor, multiple sites: Secondary | ICD-10-CM | POA: Diagnosis not present

## 2021-12-20 DIAGNOSIS — M150 Primary generalized (osteo)arthritis: Secondary | ICD-10-CM | POA: Diagnosis not present

## 2021-12-20 DIAGNOSIS — Z6827 Body mass index (BMI) 27.0-27.9, adult: Secondary | ICD-10-CM | POA: Diagnosis not present

## 2021-12-20 DIAGNOSIS — M2550 Pain in unspecified joint: Secondary | ICD-10-CM | POA: Diagnosis not present

## 2022-02-13 ENCOUNTER — Telehealth: Payer: Self-pay | Admitting: Internal Medicine

## 2022-02-13 NOTE — Telephone Encounter (Signed)
LVM for pt to rtn my call to schedule AWV with [facility]. Please schedule this appt if pt calls the office.  °

## 2022-03-20 DIAGNOSIS — M0609 Rheumatoid arthritis without rheumatoid factor, multiple sites: Secondary | ICD-10-CM | POA: Diagnosis not present

## 2022-05-06 ENCOUNTER — Ambulatory Visit (INDEPENDENT_AMBULATORY_CARE_PROVIDER_SITE_OTHER): Payer: Medicare PPO

## 2022-05-06 DIAGNOSIS — Z0000 Encounter for general adult medical examination without abnormal findings: Secondary | ICD-10-CM

## 2022-06-07 ENCOUNTER — Encounter: Payer: Self-pay | Admitting: Internal Medicine

## 2022-06-07 ENCOUNTER — Ambulatory Visit: Payer: Medicare PPO | Admitting: Internal Medicine

## 2022-06-07 VITALS — BP 122/80 | HR 95 | Resp 18 | Ht 66.0 in | Wt 174.6 lb

## 2022-06-07 DIAGNOSIS — R21 Rash and other nonspecific skin eruption: Secondary | ICD-10-CM | POA: Diagnosis not present

## 2022-06-07 MED ORDER — METHYLPREDNISOLONE ACETATE 40 MG/ML IJ SUSP
40.0000 mg | Freq: Once | INTRAMUSCULAR | Status: AC
  Administered 2022-06-07: 40 mg via INTRAMUSCULAR

## 2022-06-07 NOTE — Progress Notes (Signed)
   Subjective:   Patient ID: Linda Soto, female    DOB: 1941-03-25, 81 y.o.   MRN: [id number]  Rash Pertinent negatives include no cough, diarrhea, shortness of breath or vomiting.   The patient is a 81 YO female coming in for rash after gardening. Started Tuesday night.   Review of Systems  Constitutional: Negative.   HENT: Negative.    Eyes: Negative.   Respiratory:  Negative for cough, chest tightness and shortness of breath.   Cardiovascular:  Negative for chest pain, palpitations and leg swelling.  Gastrointestinal:  Negative for abdominal distention, abdominal pain, constipation, diarrhea, nausea and vomiting.  Musculoskeletal: Negative.   Skin:  Positive for rash.  Neurological: Negative.   Psychiatric/Behavioral: Negative.      Objective:  Physical Exam Constitutional:      Appearance: She is well-developed.  HENT:     Head: Normocephalic and atraumatic.  Cardiovascular:     Rate and Rhythm: Normal rate and regular rhythm.  Pulmonary:     Effort: Pulmonary effort is normal. No respiratory distress.     Breath sounds: Normal breath sounds. No wheezing or rales.  Abdominal:     General: Bowel sounds are normal. There is no distension.     Palpations: Abdomen is soft.     Tenderness: There is no abdominal tenderness. There is no rebound.  Musculoskeletal:     Cervical back: Normal range of motion.  Skin:    General: Skin is warm and dry.  Neurological:     Mental Status: She is alert and oriented to person, place, and time.     Coordination: Coordination normal.     Vitals:   06/07/22 1041  BP: 122/80  Pulse: 95  Resp: 18  SpO2: 97%  Weight: 174 lb 9.6 oz (79.2 kg)  Height: 5\' 6"  (1.676 m)    Assessment & Plan:

## 2022-06-07 NOTE — Patient Instructions (Signed)
We have given you the steroid shot today and want you to take zyrtec (cetirizine) 10 mg 3 times a day (over the counter) for the next week or so to help this go away faster.

## 2022-06-07 NOTE — Assessment & Plan Note (Signed)
Given depo-medrol 40 mg IM at visit and encouraged to use benadryl gel and take zyrtec otc 10 mg TID for the next few days to week to help resolve this likely contact dermatitis. Unknown trigger but may have been outdoor plant. No new cream, lotion, meds, clothes, products, foods.

## 2022-07-02 DIAGNOSIS — Z79899 Other long term (current) drug therapy: Secondary | ICD-10-CM | POA: Diagnosis not present

## 2022-07-02 DIAGNOSIS — Z6827 Body mass index (BMI) 27.0-27.9, adult: Secondary | ICD-10-CM | POA: Diagnosis not present

## 2022-07-02 DIAGNOSIS — M0609 Rheumatoid arthritis without rheumatoid factor, multiple sites: Secondary | ICD-10-CM | POA: Diagnosis not present

## 2022-07-02 DIAGNOSIS — M1991 Primary osteoarthritis, unspecified site: Secondary | ICD-10-CM | POA: Diagnosis not present

## 2022-07-02 DIAGNOSIS — E663 Overweight: Secondary | ICD-10-CM | POA: Diagnosis not present

## 2022-07-25 DIAGNOSIS — H52223 Regular astigmatism, bilateral: Secondary | ICD-10-CM | POA: Diagnosis not present

## 2022-07-25 DIAGNOSIS — H5203 Hypermetropia, bilateral: Secondary | ICD-10-CM | POA: Diagnosis not present

## 2022-07-25 DIAGNOSIS — H2513 Age-related nuclear cataract, bilateral: Secondary | ICD-10-CM | POA: Diagnosis not present

## 2022-10-08 DIAGNOSIS — M0609 Rheumatoid arthritis without rheumatoid factor, multiple sites: Secondary | ICD-10-CM | POA: Diagnosis not present

## 2022-10-28 ENCOUNTER — Ambulatory Visit (INDEPENDENT_AMBULATORY_CARE_PROVIDER_SITE_OTHER): Payer: Medicare PPO | Admitting: Internal Medicine

## 2022-10-28 ENCOUNTER — Encounter: Payer: Self-pay | Admitting: Internal Medicine

## 2022-10-28 VITALS — BP 120/78 | HR 81 | Temp 97.9°F | Ht 66.0 in | Wt 178.0 lb

## 2022-10-28 DIAGNOSIS — H6123 Impacted cerumen, bilateral: Secondary | ICD-10-CM

## 2022-10-28 DIAGNOSIS — Z0001 Encounter for general adult medical examination with abnormal findings: Secondary | ICD-10-CM

## 2022-10-28 DIAGNOSIS — M069 Rheumatoid arthritis, unspecified: Secondary | ICD-10-CM

## 2022-10-28 DIAGNOSIS — R739 Hyperglycemia, unspecified: Secondary | ICD-10-CM

## 2022-10-28 DIAGNOSIS — E2839 Other primary ovarian failure: Secondary | ICD-10-CM

## 2022-10-28 DIAGNOSIS — E038 Other specified hypothyroidism: Secondary | ICD-10-CM

## 2022-10-28 DIAGNOSIS — E559 Vitamin D deficiency, unspecified: Secondary | ICD-10-CM | POA: Diagnosis not present

## 2022-10-28 DIAGNOSIS — H6120 Impacted cerumen, unspecified ear: Secondary | ICD-10-CM | POA: Insufficient documentation

## 2022-10-28 DIAGNOSIS — E538 Deficiency of other specified B group vitamins: Secondary | ICD-10-CM | POA: Diagnosis not present

## 2022-10-28 LAB — CBC WITH DIFFERENTIAL/PLATELET
Basophils Absolute: 0 10*3/uL (ref 0.0–0.1)
Basophils Relative: 0.4 % (ref 0.0–3.0)
Eosinophils Absolute: 0.1 10*3/uL (ref 0.0–0.7)
Eosinophils Relative: 1.7 % (ref 0.0–5.0)
HCT: 44.4 % (ref 36.0–46.0)
Hemoglobin: 15.2 g/dL — ABNORMAL HIGH (ref 12.0–15.0)
Lymphocytes Relative: 18 % (ref 12.0–46.0)
Lymphs Abs: 1.1 10*3/uL (ref 0.7–4.0)
MCHC: 34.2 g/dL (ref 30.0–36.0)
MCV: 97.9 fl (ref 78.0–100.0)
Monocytes Absolute: 0.5 10*3/uL (ref 0.1–1.0)
Monocytes Relative: 7.6 % (ref 3.0–12.0)
Neutro Abs: 4.3 10*3/uL (ref 1.4–7.7)
Neutrophils Relative %: 72.3 % (ref 43.0–77.0)
Platelets: 252 10*3/uL (ref 150.0–400.0)
RBC: 4.53 Mil/uL (ref 3.87–5.11)
RDW: 16.3 % — ABNORMAL HIGH (ref 11.5–15.5)
WBC: 6 10*3/uL (ref 4.0–10.5)

## 2022-10-28 LAB — HEPATIC FUNCTION PANEL
ALT: 32 U/L (ref 0–35)
AST: 26 U/L (ref 0–37)
Albumin: 4.3 g/dL (ref 3.5–5.2)
Alkaline Phosphatase: 85 U/L (ref 39–117)
Bilirubin, Direct: 0.1 mg/dL (ref 0.0–0.3)
Total Bilirubin: 0.6 mg/dL (ref 0.2–1.2)
Total Protein: 7.3 g/dL (ref 6.0–8.3)

## 2022-10-28 LAB — TSH: TSH: 5.06 u[IU]/mL (ref 0.35–5.50)

## 2022-10-28 LAB — HEMOGLOBIN A1C: Hgb A1c MFr Bld: 5.7 % (ref 4.6–6.5)

## 2022-10-28 LAB — BASIC METABOLIC PANEL
BUN: 11 mg/dL (ref 6–23)
CO2: 28 mEq/L (ref 19–32)
Calcium: 9.4 mg/dL (ref 8.4–10.5)
Chloride: 105 mEq/L (ref 96–112)
Creatinine, Ser: 0.86 mg/dL (ref 0.40–1.20)
GFR: 64.27 mL/min (ref >60.00)
Glucose, Bld: 100 mg/dL — ABNORMAL HIGH (ref 70–99)
Potassium: 4 mEq/L (ref 3.5–5.1)
Sodium: 142 mEq/L (ref 135–145)

## 2022-10-28 LAB — MICROALBUMIN / CREATININE URINE RATIO
Creatinine,U: 132.9 mg/dL
Microalb Creat Ratio: 0.5 mg/g (ref 0.0–30.0)
Microalb, Ur: <0.7 mg/dL (ref 0.0–1.9)

## 2022-10-28 LAB — LIPID PANEL
Cholesterol: 181 mg/dL (ref 0–200)
HDL: 78.5 mg/dL (ref >39.00)
LDL Cholesterol: 91 mg/dL (ref 0–99)
NonHDL: 102.3
Total CHOL/HDL Ratio: 2
Triglycerides: 57 mg/dL (ref 0.0–149.0)
VLDL: 11.4 mg/dL (ref 0.0–40.0)

## 2022-10-28 LAB — VITAMIN B12: Vitamin B-12: 890 pg/mL (ref 211–911)

## 2022-10-28 LAB — VITAMIN D 25 HYDROXY (VIT D DEFICIENCY, FRACTURES): VITD: 49.41 ng/mL (ref 30.00–100.00)

## 2022-10-28 NOTE — Assessment & Plan Note (Signed)
Ceruminosis is noted.  Wax is removed by syringing and manual debridement. Instructions for home care to prevent wax buildup are given.  

## 2022-10-28 NOTE — Addendum Note (Signed)
Addended by: Coolidge Breeze on: 10/28/2022 10:07 AM   Modules accepted: Orders

## 2022-10-28 NOTE — Patient Instructions (Addendum)
Please consider having your Tdap tetanus shot at the pharmacy  Your ears were irrigated of wax today  Please schedule the bone density test before leaving today at the scheduling desk (where you check out)  Please continue all other medications as before, and refills have been done if requested.  Please have the pharmacy call with any other refills you may need.  Please continue your efforts at being more active, low cholesterol diet, and weight control.  You are otherwise up to date with prevention measures today.  Please keep your appointments with your specialists as you may have planned  Please go to the LAB at the blood drawing area for the tests to be done  You will be contacted by phone if any changes need to be made immediately.  Otherwise, you will receive a letter about your results with an explanation, but please check with MyChart first.  Please remember to sign up for MyChart if you have not done so, as this will be important to you in the future with finding out test results, communicating by private email, and scheduling acute appointments online when needed.  Please make an Appointment to return for your 1 year visit, or sooner if needed

## 2022-10-28 NOTE — Assessment & Plan Note (Signed)
Lab Results  Component Value Date   HGBA1C 5.6 10/26/2021   Stable, pt to continue current medical treatment  - diet, wt control, excercise

## 2022-10-28 NOTE — Assessment & Plan Note (Signed)
Age and sex appropriate education and counseling updated with regular exercise and diet Referrals for preventative services - for DXA Immunizations addressed - for Tdap at the pharmacy Smoking counseling  - none needed Evidence for depression or other mood disorder - none significant Most recent labs reviewed. I have personally reviewed and have noted: 1) the patient's medical and social history 2) The patient's current medications and supplements 3) The patient's height, weight, and BMI have been recorded in the chart

## 2022-10-28 NOTE — Assessment & Plan Note (Signed)
Also for Dxa as is due

## 2022-10-28 NOTE — Assessment & Plan Note (Signed)
Last vitamin D Lab Results  Component Value Date   VD25OH 35.30 10/26/2021   Low, not taking oral replacement, for f/u lab

## 2022-10-28 NOTE — Progress Notes (Signed)
Patient ID: Linda Soto, female   DOB: [birth year], 81 y.o.   MRN: [id number]         Chief Complaint:: wellness exam and osteopenia estrogen deficiency, hyperglycemia, hypothryoidism, low vit d, impacted wax bilateral, RA       HPI:  Linda Soto is a 81 y.o. female here for wellness exam; due for DXA, for Tdap at the pharmacy, o/w up to date                        Also RA improved recently, now taking MTX only.  Pt denies chest pain, increased sob or doe, wheezing, orthopnea, PND, increased LE swelling, palpitations, dizziness or syncope.   Pt denies polydipsia, polyuria, or new focal neuro s/s.    Pt denies fever, wt loss, night sweats, loss of appetite, or other constitutional symptoms     Wt Readings from Last 3 Encounters:  10/28/22 178 lb (80.7 kg)  06/07/22 174 lb 9.6 oz (79.2 kg)  10/26/21 176 lb 6.4 oz (80 kg)   BP Readings from Last 3 Encounters:  10/28/22 120/78  06/07/22 122/80  10/26/21 128/72   Immunization History  Administered Date(s) Administered   Fluad Quad(high Dose 65+) 08/27/2019, 09/27/2021, 09/24/2022   Influenza, High Dose Seasonal PF 09/01/2013, 09/20/2014, 09/29/2017, 09/23/2018   Influenza,inj,Quad PF,6+ Mos 09/04/2015   Influenza-Unspecified 10/01/2020   PFIZER(Purple Top)SARS-COV-2 Vaccination 12/03/2019, 12/24/2019, 08/08/2020, 07/26/2021   Pfizer Covid-19 Vaccine Bivalent Booster 54yrs & up 09/10/2022   Pneumococcal Conjugate-13 09/04/2015   Pneumococcal Polysaccharide-23 10/26/2021   Pneumococcal-Unspecified 11/11/2010   Tetanus 09/01/2013   Zoster Recombinat (Shingrix) 10/08/2017, 10/08/2017, 11/28/2017, 02/25/2018   Zoster, Live 10/27/2015   There are no preventive care reminders to display for this patient.     Past Medical History:  Diagnosis Date   Allergic rhinitis    Dense breasts    f/u mammo rec 2013 because of same   Diverticular disease of left colon    Menopausal symptom 1997   Osteopenia    at femur, follows DEXA with  gyn q 34yrs   RA (rheumatoid arthritis) (HCC)    Past Surgical History:  Procedure Laterality Date   NO PAST SURGERIES      reports that she has quit smoking. Her smoking use included cigarettes. She has never used smokeless tobacco. She reports current alcohol use. She reports that she does not use drugs. family history includes Arthritis in her mother; Breast cancer in her maternal aunt; Lung cancer in her maternal aunt; Myasthenia gravis (age of onset: 73) in her mother; Osteoporosis in her mother; Prostate cancer (age of onset: 29) in her father; Uterine cancer in her maternal aunt. No Known Allergies Current Outpatient Medications on File Prior to Visit  Medication Sig Dispense Refill   calcium citrate-vitamin D (CITRACAL+D) 315-200 MG-UNIT tablet Take 1 tablet by mouth 2 (two) times daily.     cholecalciferol (VITAMIN D3) 25 MCG (1000 UNIT) tablet      folic acid (FOLVITE) 1 MG tablet Take 1 mg by mouth daily.  0   methotrexate (RHEUMATREX) 2.5 MG tablet 12.5 mg 2 (two) times a week.  0   Multiple Vitamins-Minerals (CENTRUM ADULTS PO) Take by mouth.     No current facility-administered medications on file prior to visit.        ROS:  All others reviewed and negative.  Objective        PE:  BP 120/78 (BP Location: Left Arm, Patient Position:  Sitting, Cuff Size: Large)   Pulse 81   Temp 97.9 F (36.6 C) (Oral)   Ht 5\' 6"  (1.676 m)   Wt 178 lb (80.7 kg)   SpO2 97%   BMI 28.73 kg/m                 Constitutional: Pt appears in NAD               HENT: Head: NCAT.                Right Ear: External ear normal.                 Left Ear: External ear normal.                Eyes: . Pupils are equal, round, and reactive to light. Conjunctivae and EOM are normal               Nose: without d/c or deformity               Neck: Neck supple. Gross normal ROM               Cardiovascular: Normal rate and regular rhythm.                 Pulmonary/Chest: Effort normal and breath sounds  without rales or wheezing.                Abd:  Soft, NT, ND, + BS, no organomegaly               Neurological: Pt is alert. At baseline orientation, motor grossly intact               Skin: Skin is warm. No rashes, no other new lesions, LE edema - none               Psychiatric: Pt behavior is normal without agitation   Micro: none  Cardiac tracings I have personally interpreted today:  none  Pertinent Radiological findings (summarize): none   Lab Results  Component Value Date   WBC 6.4 10/26/2021   HGB 14.2 10/26/2021   HCT 42.5 10/26/2021   PLT 228.0 10/26/2021   GLUCOSE 86 10/26/2021   CHOL 159 10/26/2021   TRIG 43.0 10/26/2021   HDL 87.00 10/26/2021   LDLCALC 63 10/26/2021   ALT 22 10/26/2021   AST 24 10/26/2021   NA 141 10/26/2021   K 4.4 10/26/2021   CL 105 10/26/2021   CREATININE 0.77 10/26/2021   BUN 10 10/26/2021   CO2 29 10/26/2021   TSH 4.23 10/26/2021   HGBA1C 5.6 10/26/2021   Assessment/Plan:  Linda Soto is a 81 y.o. Black or African American [2] female with  has a past medical history of Allergic rhinitis, Dense breasts, Diverticular disease of left colon, Menopausal symptom (1997), Osteopenia, and RA (rheumatoid arthritis) (HCC).  Impacted ear wax Ceruminosis is noted.  Wax is removed by syringing and manual debridement. Instructions for home care to prevent wax buildup are given.   Encounter for well adult exam with abnormal findings Age and sex appropriate education and counseling updated with regular exercise and diet Referrals for preventative services - for DXA Immunizations addressed - for Tdap at the pharmacy Smoking counseling  - none needed Evidence for depression or other mood disorder - none significant Most recent labs reviewed. I have personally reviewed and have noted: 1) the patient's medical and social history 2) The patient's current medications  and supplements 3) The patient's height, weight, and BMI have been recorded in the  chart   RA (rheumatoid arthritis) (HCC) Improved recently, cont mtx, f/u rheum as planned  Hyperglycemia Lab Results  Component Value Date   HGBA1C 5.6 10/26/2021   Stable, pt to continue current medical treatment  - diet, wt control, excercise   Subclinical hypothyroidism Lab Results  Component Value Date   TSH 4.23 10/26/2021   Stable, pt to continue without levothyroxine for now, f/u lab today   Vitamin D deficiency Last vitamin D Lab Results  Component Value Date   VD25OH 35.30 10/26/2021   Low, not taking oral replacement, for f/u lab  Estrogen deficiency Also for Dxa as is due  Followup: Return in about 1 year (around 10/29/2023).  Oliver Barre, MD 10/28/2022 9:45 AM [facility]

## 2022-10-28 NOTE — Assessment & Plan Note (Signed)
Lab Results  Component Value Date   TSH 4.23 10/26/2021   Stable, pt to continue without levothyroxine for now, f/u lab today

## 2022-10-28 NOTE — Assessment & Plan Note (Signed)
Improved recently, cont mtx, f/u rheum as planned

## 2022-10-29 LAB — URINALYSIS, ROUTINE W REFLEX MICROSCOPIC
Bilirubin Urine: NEGATIVE
Ketones, ur: NEGATIVE
Leukocytes,Ua: NEGATIVE
Nitrite: NEGATIVE
Specific Gravity, Urine: >=1.03 — AB (ref 1.000–1.030)
Total Protein, Urine: NEGATIVE
Urine Glucose: NEGATIVE
Urobilinogen, UA: 0.2 (ref 0.0–1.0)
pH: 6 (ref 5.0–8.0)

## 2022-11-20 ENCOUNTER — Ambulatory Visit (INDEPENDENT_AMBULATORY_CARE_PROVIDER_SITE_OTHER)
Admission: RE | Admit: 2022-11-20 | Discharge: 2022-11-20 | Disposition: A | Discharge status: Home / Self Care | Payer: Medicare PPO | Source: Ambulatory Visit | Attending: Internal Medicine | Admitting: Internal Medicine

## 2022-11-20 DIAGNOSIS — E2839 Other primary ovarian failure: Secondary | ICD-10-CM

## 2022-11-21 ENCOUNTER — Other Ambulatory Visit: Payer: Self-pay | Admitting: Internal Medicine

## 2022-11-21 MED ORDER — ALENDRONATE SODIUM 70 MG PO TABS: 70.0000 mg | ORAL_TABLET | ORAL | 3 refills | Status: DC

## 2022-11-27 DIAGNOSIS — Z1239 Encounter for other screening for malignant neoplasm of breast: Secondary | ICD-10-CM | POA: Diagnosis not present

## 2022-11-27 DIAGNOSIS — Z780 Asymptomatic menopausal state: Secondary | ICD-10-CM | POA: Diagnosis not present

## 2022-11-27 DIAGNOSIS — Z01419 Encounter for gynecological examination (general) (routine) without abnormal findings: Secondary | ICD-10-CM | POA: Diagnosis not present

## 2022-11-27 DIAGNOSIS — Z1231 Encounter for screening mammogram for malignant neoplasm of breast: Secondary | ICD-10-CM | POA: Diagnosis not present

## 2023-01-07 DIAGNOSIS — Z6828 Body mass index (BMI) 28.0-28.9, adult: Secondary | ICD-10-CM | POA: Diagnosis not present

## 2023-01-07 DIAGNOSIS — M1991 Primary osteoarthritis, unspecified site: Secondary | ICD-10-CM | POA: Diagnosis not present

## 2023-01-07 DIAGNOSIS — Z79899 Other long term (current) drug therapy: Secondary | ICD-10-CM | POA: Diagnosis not present

## 2023-01-07 DIAGNOSIS — M0609 Rheumatoid arthritis without rheumatoid factor, multiple sites: Secondary | ICD-10-CM | POA: Diagnosis not present

## 2023-01-07 DIAGNOSIS — E663 Overweight: Secondary | ICD-10-CM | POA: Diagnosis not present

## 2023-04-01 DIAGNOSIS — M0609 Rheumatoid arthritis without rheumatoid factor, multiple sites: Secondary | ICD-10-CM | POA: Diagnosis not present

## 2023-05-08 ENCOUNTER — Ambulatory Visit (INDEPENDENT_AMBULATORY_CARE_PROVIDER_SITE_OTHER): Payer: Medicare PPO

## 2023-05-08 DIAGNOSIS — Z0000 Encounter for general adult medical examination without abnormal findings: Secondary | ICD-10-CM

## 2023-05-08 NOTE — Patient Instructions (Signed)

## 2023-05-08 NOTE — Progress Notes (Signed)
Subjective:   Linda Soto is a 82 y.o. female who presents for Medicare Annual (Subsequent) preventive examination.  Visit Complete: Virtual  I connected with  Linda Soto on 05/08/23 by a audio enabled telemedicine application and verified that I am speaking with the correct person using two identifiers.  Patient Location: Home  Provider Location: Home Office  I discussed the limitations of evaluation and management by telemedicine. The patient expressed understanding and agreed to proceed.  Patient Medicare AWV questionnaire was completed by the patient on 05/08/2023; I have confirmed that all information answered by patient is correct and no changes since this date.  Cardiac Risk Factors include: advanced age (>14men, >76 women)     Objective:    Today's Vitals   There is no height or weight on file to calculate BMI.     05/08/2023    3:04 PM 05/06/2022    3:08 PM 02/07/2021   10:13 AM 10/11/2019   10:26 AM 09/29/2017    2:30 PM  Advanced Directives  Does Patient Have a Medical Advance Directive? Yes Yes Yes Yes Yes  Type of Estate agent of [place];Living will Healthcare Power of [place];Living will Healthcare Power of [employer] of [place];Living will Healthcare Power of [place];Living will  Copy of Healthcare Power of Attorney in Chart? No - copy requested No - copy requested No - copy requested No - copy requested No - copy requested    Current Medications (verified) Outpatient Encounter Medications as of 05/08/2023  Medication Sig   calcium citrate-vitamin D (CITRACAL+D) 315-200 MG-UNIT tablet Take 1 tablet by mouth 2 (two) times daily.   cholecalciferol (VITAMIN D3) 25 MCG (1000 UNIT) tablet    folic acid (FOLVITE) 1 MG tablet Take 1 mg by mouth daily.   methotrexate (RHEUMATREX) 2.5 MG tablet 12.5 mg 2 (two) times a week.   Multiple Vitamins-Minerals (CENTRUM ADULTS PO) Take by mouth.   [DISCONTINUED] alendronate  (FOSAMAX) 70 MG tablet Take 1 tablet (70 mg total) by mouth every 7 (seven) days. Take with a full glass of water on an empty stomach.   No facility-administered encounter medications on file as of 05/08/2023.    Allergies (verified) Patient has no known allergies.   History: Past Medical History:  Diagnosis Date   Allergic rhinitis    Dense breasts    f/u mammo rec 2013 because of same   Diverticular disease of left colon    Menopausal symptom 1997   Osteopenia    at femur, follows DEXA with gyn q 66yrs   RA (rheumatoid arthritis) (HCC)    Past Surgical History:  Procedure Laterality Date   NO PAST SURGERIES     Family History  Problem Relation Age of Onset   Prostate cancer Father 52   Arthritis Mother    Osteoporosis Mother        vertebral compression fx   Myasthenia gravis Mother 69   Lung cancer Maternal Aunt    Breast cancer Maternal Aunt    Uterine cancer Maternal Aunt    Social History   Socioeconomic History   Marital status: Divorced    Spouse name: Not on file   Number of children: Not on file   Years of education: Not on file   Highest education level: Not on file  Occupational History   Occupation: retired  Tobacco Use   Smoking status: Former    Types: Cigarettes   Smokeless tobacco: Never   Tobacco comments:  quit in 1975  Vaping Use   Vaping Use: Never used  Substance and Sexual Activity   Alcohol use: Yes    Alcohol/week: 0.0 standard drinks of alcohol    Comment: occassional    Drug use: No   Sexual activity: Never  Other Topics Concern   Not on file  Social History Narrative   divorced, lives alone - retired from [employer] in 2011   Social Determinants of Health   Financial Resource Strain: Low Risk  (05/06/2022)   Overall Financial Resource Strain (CARDIA)    Difficulty of Paying Living Expenses: Not hard at all  Food Insecurity: No Food Insecurity (05/08/2023)   Hunger Vital Sign    Worried About Running Out of  Food in the Last Year: Never true    Ran Out of Food in the Last Year: Never true  Transportation Needs: No Transportation Needs (05/08/2023)   PRAPARE - Administrator, Civil Service (Medical): No    Lack of Transportation (Non-Medical): No  Physical Activity: Sufficiently Active (05/06/2022)   Exercise Vital Sign    Days of Exercise per Week: 5 days    Minutes of Exercise per Session: 60 min  Stress: No Stress Concern Present (05/06/2022)   [employer] of Occupational Health - Occupational Stress Questionnaire    Feeling of Stress : Not at all  Social Connections: Moderately Integrated (05/08/2023)   Social Connection and Isolation Panel [NHANES]    Frequency of Communication with Friends and Family: More than three times a week    Frequency of Social Gatherings with Friends and Family: Three times a week    Attends Religious Services: More than 4 times per year    Active Member of Clubs or Organizations: Yes    Attends Engineer, structural: More than 4 times per year    Marital Status: Divorced    Tobacco Counseling Counseling given: Not Answered Tobacco comments: quit in 1975   Clinical Intake:  Pre-visit preparation completed: Yes  Pain : No/denies pain     Nutritional Risks: None Diabetes: No  How often do you need to have someone help you when you read instructions, pamphlets, or other written materials from your doctor or pharmacy?: 1 - Never What is the last grade level you completed in school?: graduate school  Interpreter Needed?: No      Activities of Daily Living    05/08/2023    3:01 PM 05/05/2023    1:52 PM  In your present state of health, do you have any difficulty performing the following activities:  Hearing? 0 0  Vision? 0 0  Difficulty concentrating or making decisions? 0 0  Walking or climbing stairs? 0 0  Dressing or bathing? 0 0  Doing errands, shopping? 0 0  Preparing Food and eating ? N N  Using the Toilet? N N   In the past six months, have you accidently leaked urine? N N  Do you have problems with loss of bowel control? N N  Managing your Medications? N N  Managing your Finances? N N  Housekeeping or managing your Housekeeping? N N    Patient Care Team: Corwin Levins, MD as PCP - General (Internal Medicine) Dimitri Ped, MD as Consulting Physician (Ophthalmology) Jaymes Graff, MD as Consulting Physician (Obstetrics and Gynecology)  Indicate any recent Medical Services you may have received from other than [facility] providers in the past year (date may be approximate).     Assessment:  This is a routine wellness examination for Linda Soto.  Hearing/Vision screen No results found.  Dietary issues and exercise activities discussed:     Goals Addressed             This Visit's Progress    none        Depression Screen    05/08/2023    3:05 PM 10/28/2022    8:59 AM 06/07/2022   10:45 AM 05/06/2022    3:09 PM 05/06/2022    3:07 PM 10/26/2021    9:37 AM 02/07/2021   10:13 AM  PHQ 2/9 Scores  PHQ - 2 Score 0 0 0 0 0 0 0  PHQ- 9 Score 0 0 0        Fall Risk    05/08/2023    3:04 PM 05/05/2023    1:52 PM 10/28/2022    8:59 AM 06/07/2022   10:46 AM 05/06/2022    3:09 PM  Fall Risk   Falls in the past year? 0 0 0 0 0  Number falls in past yr: 0 0  0 0  Injury with Fall? 0 0 0 0 0  Risk for fall due to : No Fall Risks  No Fall Risks    Follow up Falls evaluation completed  Falls evaluation completed  Falls evaluation completed;Education provided    MEDICARE RISK AT HOME:  Medicare Risk at Home - 05/08/23 1504     Any stairs in or around the home? Yes    If so, are there any without handrails? No    Home free of loose throw rugs in walkways, pet beds, electrical cords, etc? Yes    Adequate lighting in your home to reduce risk of falls? Yes    Life alert? No    Use of a cane, walker or w/c? No    Grab bars in the bathroom? Yes    Shower chair or bench in shower? Yes     Elevated toilet seat or a handicapped toilet? No             TIMED UP AND GO:  Was the test performed?  No    Cognitive Function:    09/29/2017    2:30 PM  MMSE - Mini Mental State Exam  Orientation to time 5  Orientation to Place 5  Registration 3  Attention/ Calculation 5  Recall 3  Language- name 2 objects 2  Language- repeat 1  Language- follow 3 step command 3  Language- read & follow direction 1  Write a sentence 1  Copy design 1  Total score 30        05/08/2023    3:07 PM  6CIT Screen  What Year? 0 points  What month? 0 points  What time? 0 points  Count back from 20 0 points  Months in reverse 0 points  Repeat phrase 2 points  Total Score 2 points    Immunizations Immunization History  Administered Date(s) Administered   Fluad Quad(high Dose 65+) 08/27/2019, 09/27/2021, 09/24/2022   Influenza, High Dose Seasonal PF 09/01/2013, 09/20/2014, 09/29/2017, 09/23/2018   Influenza,inj,Quad PF,6+ Mos 09/04/2015   Influenza-Unspecified 10/01/2020   PFIZER(Purple Top)SARS-COV-2 Vaccination 12/03/2019, 12/24/2019, 08/08/2020, 07/26/2021   Pfizer Covid-19 Vaccine Bivalent Booster 20yrs & up 09/10/2022   Pneumococcal Conjugate-13 09/04/2015   Pneumococcal Polysaccharide-23 10/26/2021   Pneumococcal-Unspecified 11/11/2010   Tetanus 09/01/2013   Zoster Recombinat (Shingrix) 10/08/2017, 10/08/2017, 11/28/2017, 02/25/2018   Zoster, Live 10/27/2015    TDAP status: Up to date  Flu Vaccine status: Up to date  Pneumococcal vaccine status: Up to date  Covid-19 vaccine status: Completed vaccines  Qualifies for Shingles Vaccine? Yes   Zostavax completed Yes   Shingrix Completed?: Yes  Screening Tests Health Maintenance  Topic Date Due   COVID-19 Vaccine (6 - 2023-24 season) 11/05/2022   INFLUENZA VACCINE  06/12/2023   Medicare Annual Wellness (AWV)  05/07/2024   Pneumonia Vaccine 72+ Years old  Completed   DEXA SCAN  Completed   Hepatitis C Screening   Completed   Zoster Vaccines- Shingrix  Completed   HPV VACCINES  Aged Out   DTaP/Tdap/Td  Discontinued   Colonoscopy  Discontinued    Health Maintenance  Health Maintenance Due  Topic Date Due   COVID-19 Vaccine (6 - 2023-24 season) 11/05/2022    Colorectal cancer screening: No longer required.   Mammogram: done is December 2023  Bone density completed  11/20/2022  Lung Cancer Screening: (Low Dose CT Chest recommended if Age 65-80 years, 20 pack-year currently smoking OR have quit w/in 15years.) does not qualify.   Lung Cancer Screening Referral: na  Additional Screening:  Hepatitis C Screening: does qualify; Completed 10/26/2020  Vision Screening: Recommended annual ophthalmology exams for early detection of glaucoma and other disorders of the eye. Is the patient up to date with their annual eye exam?  Yes  Who is the provider or what is the name of the office in which the patient attends annual eye exams? [facility]  If pt is not established with a provider, would they like to be referred to a provider to establish care?  Established  .   Dental Screening: Recommended annual dental exams for proper oral hygiene  Diabetic Foot Exam:   Community Resource Referral / Chronic Care Management: CRR required this visit?  No   CCM required this visit?  No     Plan:     I have personally reviewed and noted the following in the patient's chart:   Medical and social history Use of alcohol, tobacco or illicit drugs  Current medications and supplements including opioid prescriptions. Patient is not currently taking opioid prescriptions. Functional ability and status Nutritional status Physical activity Advanced directives List of other physicians Hospitalizations, surgeries, and ER visits in previous 12 months Vitals Screenings to include cognitive, depression, and falls Referrals and appointments  In addition, I have reviewed and discussed with patient  certain preventive protocols, quality metrics, and best practice recommendations. A written personalized care plan for preventive services as well as general preventive health recommendations were provided to patient.     Delana Meyer   05/08/2023   After Visit Summary: (MyChart) Due to this being a telephonic visit, the after visit summary with patients personalized plan was offered to patient via MyChart   Nurse Notes:

## 2023-07-01 DIAGNOSIS — E663 Overweight: Secondary | ICD-10-CM | POA: Diagnosis not present

## 2023-07-01 DIAGNOSIS — M79642 Pain in left hand: Secondary | ICD-10-CM | POA: Diagnosis not present

## 2023-07-01 DIAGNOSIS — M1991 Primary osteoarthritis, unspecified site: Secondary | ICD-10-CM | POA: Diagnosis not present

## 2023-07-01 DIAGNOSIS — Z79899 Other long term (current) drug therapy: Secondary | ICD-10-CM | POA: Diagnosis not present

## 2023-07-01 DIAGNOSIS — M25512 Pain in left shoulder: Secondary | ICD-10-CM | POA: Diagnosis not present

## 2023-07-01 DIAGNOSIS — M0609 Rheumatoid arthritis without rheumatoid factor, multiple sites: Secondary | ICD-10-CM | POA: Diagnosis not present

## 2023-07-01 DIAGNOSIS — Z6828 Body mass index (BMI) 28.0-28.9, adult: Secondary | ICD-10-CM | POA: Diagnosis not present

## 2023-08-14 DIAGNOSIS — H52223 Regular astigmatism, bilateral: Secondary | ICD-10-CM | POA: Diagnosis not present

## 2023-08-14 DIAGNOSIS — H2513 Age-related nuclear cataract, bilateral: Secondary | ICD-10-CM | POA: Diagnosis not present

## 2023-08-14 DIAGNOSIS — H5203 Hypermetropia, bilateral: Secondary | ICD-10-CM | POA: Diagnosis not present

## 2023-08-14 DIAGNOSIS — H524 Presbyopia: Secondary | ICD-10-CM | POA: Diagnosis not present

## 2023-09-11 DIAGNOSIS — E663 Overweight: Secondary | ICD-10-CM | POA: Diagnosis not present

## 2023-09-11 DIAGNOSIS — Z6827 Body mass index (BMI) 27.0-27.9, adult: Secondary | ICD-10-CM | POA: Diagnosis not present

## 2023-09-11 DIAGNOSIS — Z79899 Other long term (current) drug therapy: Secondary | ICD-10-CM | POA: Diagnosis not present

## 2023-09-11 DIAGNOSIS — M0609 Rheumatoid arthritis without rheumatoid factor, multiple sites: Secondary | ICD-10-CM | POA: Diagnosis not present

## 2023-09-11 DIAGNOSIS — M1991 Primary osteoarthritis, unspecified site: Secondary | ICD-10-CM | POA: Diagnosis not present

## 2023-10-15 ENCOUNTER — Ambulatory Visit: Payer: Medicare PPO

## 2023-10-15 DIAGNOSIS — Z23 Encounter for immunization: Secondary | ICD-10-CM

## 2023-10-15 NOTE — Progress Notes (Signed)
Pt was given flu vaccine with no complications.

## 2023-10-27 ENCOUNTER — Encounter: Payer: Medicare PPO | Admitting: Internal Medicine

## 2023-10-31 ENCOUNTER — Ambulatory Visit (INDEPENDENT_AMBULATORY_CARE_PROVIDER_SITE_OTHER): Payer: Medicare PPO | Admitting: Internal Medicine

## 2023-10-31 ENCOUNTER — Encounter: Payer: Self-pay | Admitting: Internal Medicine

## 2023-10-31 VITALS — BP 138/86 | HR 78 | Temp 98.2°F | Ht 66.0 in | Wt 182.0 lb

## 2023-10-31 DIAGNOSIS — Z0000 Encounter for general adult medical examination without abnormal findings: Secondary | ICD-10-CM

## 2023-10-31 DIAGNOSIS — E038 Other specified hypothyroidism: Secondary | ICD-10-CM | POA: Diagnosis not present

## 2023-10-31 DIAGNOSIS — R739 Hyperglycemia, unspecified: Secondary | ICD-10-CM | POA: Diagnosis not present

## 2023-10-31 DIAGNOSIS — E559 Vitamin D deficiency, unspecified: Secondary | ICD-10-CM

## 2023-10-31 DIAGNOSIS — Z0001 Encounter for general adult medical examination with abnormal findings: Secondary | ICD-10-CM

## 2023-10-31 DIAGNOSIS — M069 Rheumatoid arthritis, unspecified: Secondary | ICD-10-CM | POA: Diagnosis not present

## 2023-10-31 LAB — MICROALBUMIN / CREATININE URINE RATIO
Creatinine,U: 99.2 mg/dL
Microalb Creat Ratio: 0.7 mg/g (ref 0.0–30.0)
Microalb, Ur: <0.7 mg/dL (ref 0.0–1.9)

## 2023-10-31 LAB — BASIC METABOLIC PANEL
BUN: 14 mg/dL (ref 6–23)
CO2: 29 meq/L (ref 19–32)
Calcium: 9.5 mg/dL (ref 8.4–10.5)
Chloride: 106 meq/L (ref 96–112)
Creatinine, Ser: 0.78 mg/dL (ref 0.40–1.20)
GFR: 71.75 mL/min (ref >60.00)
Glucose, Bld: 98 mg/dL (ref 70–99)
Potassium: 4.4 meq/L (ref 3.5–5.1)
Sodium: 142 meq/L (ref 135–145)

## 2023-10-31 LAB — CBC WITH DIFFERENTIAL/PLATELET
Basophils Absolute: 0.1 10*3/uL (ref 0.0–0.1)
Basophils Relative: 0.8 % (ref 0.0–3.0)
Eosinophils Absolute: 0.2 10*3/uL (ref 0.0–0.7)
Eosinophils Relative: 2.7 % (ref 0.0–5.0)
HCT: 42.8 % (ref 36.0–46.0)
Hemoglobin: 14.2 g/dL (ref 12.0–15.0)
Lymphocytes Relative: 20.3 % (ref 12.0–46.0)
Lymphs Abs: 1.4 10*3/uL (ref 0.7–4.0)
MCHC: 33.2 g/dL (ref 30.0–36.0)
MCV: 100.1 fL — ABNORMAL HIGH (ref 78.0–100.0)
Monocytes Absolute: 0.5 10*3/uL (ref 0.1–1.0)
Monocytes Relative: 7.7 % (ref 3.0–12.0)
Neutro Abs: 4.8 10*3/uL (ref 1.4–7.7)
Neutrophils Relative %: 68.5 % (ref 43.0–77.0)
Platelets: 276 10*3/uL (ref 150.0–400.0)
RBC: 4.28 Mil/uL (ref 3.87–5.11)
RDW: 15.7 % — ABNORMAL HIGH (ref 11.5–15.5)
WBC: 7 10*3/uL (ref 4.0–10.5)

## 2023-10-31 LAB — LIPID PANEL
Cholesterol: 164 mg/dL (ref 0–200)
HDL: 73.6 mg/dL (ref >39.00)
LDL Cholesterol: 80 mg/dL (ref 0–99)
NonHDL: 90.46
Total CHOL/HDL Ratio: 2
Triglycerides: 53 mg/dL (ref 0.0–149.0)
VLDL: 10.6 mg/dL (ref 0.0–40.0)

## 2023-10-31 LAB — HEPATIC FUNCTION PANEL
ALT: 36 U/L — ABNORMAL HIGH (ref 0–35)
AST: 26 U/L (ref 0–37)
Albumin: 4.3 g/dL (ref 3.5–5.2)
Alkaline Phosphatase: 116 U/L (ref 39–117)
Bilirubin, Direct: 0.1 mg/dL (ref 0.0–0.3)
Total Bilirubin: 0.4 mg/dL (ref 0.2–1.2)
Total Protein: 7 g/dL (ref 6.0–8.3)

## 2023-10-31 LAB — URINALYSIS, ROUTINE W REFLEX MICROSCOPIC
Bilirubin Urine: NEGATIVE
Ketones, ur: NEGATIVE
Nitrite: NEGATIVE
Specific Gravity, Urine: 1.025 (ref 1.000–1.030)
Total Protein, Urine: NEGATIVE
Urine Glucose: NEGATIVE
Urobilinogen, UA: 0.2 (ref 0.0–1.0)
pH: 6 (ref 5.0–8.0)

## 2023-10-31 LAB — VITAMIN D 25 HYDROXY (VIT D DEFICIENCY, FRACTURES): VITD: 87.89 ng/mL (ref 30.00–100.00)

## 2023-10-31 LAB — HEMOGLOBIN A1C: Hgb A1c MFr Bld: 5.7 % (ref 4.6–6.5)

## 2023-10-31 LAB — TSH: TSH: 3.54 u[IU]/mL (ref 0.35–5.50)

## 2023-10-31 NOTE — Assessment & Plan Note (Signed)
Lab Results  Component Value Date   HGBA1C 5.7 10/31/2023   Stable, pt to continue current medical treatment  - diet, wt control

## 2023-10-31 NOTE — Assessment & Plan Note (Signed)
Lab Results  Component Value Date   TSH 3.54 10/31/2023   Stable, pt not needing current med tx

## 2023-10-31 NOTE — Assessment & Plan Note (Signed)
No current synovitis, cont current med tx and f/u rheumatology

## 2023-10-31 NOTE — Progress Notes (Signed)
Patient ID: Linda Soto, female   DOB: 1940/11/29, 82 y.o.   MRN: [id number]         Chief Complaint:: wellness exam and hyperglycemia, low vit d, low thyroid,        HPI:  Linda Soto is a 82 y.o. female here for wellness exam; decliens covid booster, for shingrix and tdap at pharmacy, o/w up to date                        Also Pt denies chest pain, increased sob or doe, wheezing, orthopnea, PND, increased LE swelling, palpitations, dizziness or syncope.   Pt denies polydipsia, polyuria, or new focal neuro s/s.    Pt denies fever, wt loss, night sweats, loss of appetite, or other constitutional symptoms  Sees rheumatology 1-2 times per yr with mild symtpoms currently well controlled.   Wt Readings from Last 3 Encounters:  10/31/23 182 lb (82.6 kg)  10/28/22 178 lb (80.7 kg)  06/07/22 174 lb 9.6 oz (79.2 kg)   BP Readings from Last 3 Encounters:  10/31/23 138/86  10/28/22 120/78  06/07/22 122/80   Immunization History  Administered Date(s) Administered   Fluad Quad(high Dose 65+) 08/27/2019, 09/27/2021, 09/24/2022   Fluad Trivalent(High Dose 65+) 10/15/2023   Influenza, High Dose Seasonal PF 09/01/2013, 09/20/2014, 09/29/2017, 09/23/2018   Influenza,inj,Quad PF,6+ Mos 09/04/2015   Influenza-Unspecified 10/01/2020   PFIZER(Purple Top)SARS-COV-2 Vaccination 12/03/2019, 12/24/2019, 08/08/2020, 07/26/2021   Pfizer Covid-19 Vaccine Bivalent Booster 85yrs & up 09/10/2022   Pneumococcal Conjugate-13 09/04/2015   Pneumococcal Polysaccharide-23 10/26/2021   Pneumococcal-Unspecified 11/11/2010   Tetanus 09/01/2013   Zoster Recombinant(Shingrix) 10/08/2017, 10/08/2017, 11/28/2017, 02/25/2018   Zoster, Live 10/27/2015   There are no preventive care reminders to display for this patient.     Past Medical History:  Diagnosis Date   Allergic rhinitis    Allergy    Dense breasts    f/u mammo rec 2013 because of same   Diverticular disease of left colon    Menopausal symptom 1997    Osteopenia    at femur, follows DEXA with gyn q 33yrs   RA (rheumatoid arthritis) (HCC)    Past Surgical History:  Procedure Laterality Date   NO PAST SURGERIES      reports that she quit smoking about 50 years ago. Her smoking use included cigarettes. She has a 2.5 pack-year smoking history. She has never used smokeless tobacco. She reports current alcohol use. She reports that she does not use drugs. family history includes Arthritis in her mother; Breast cancer in her maternal aunt; Cancer in her father; Lung cancer in her maternal aunt; Myasthenia gravis (age of onset: 75) in her mother; Osteoporosis in her mother; Prostate cancer (age of onset: 55) in her father; Uterine cancer in her maternal aunt. No Known Allergies Current Outpatient Medications on File Prior to Visit  Medication Sig Dispense Refill   calcium citrate-vitamin D (CITRACAL+D) 315-200 MG-UNIT tablet Take 1 tablet by mouth 2 (two) times daily.     cholecalciferol (VITAMIN D3) 25 MCG (1000 UNIT) tablet      folic acid (FOLVITE) 1 MG tablet Take 1 mg by mouth daily.  0   methotrexate (RHEUMATREX) 2.5 MG tablet 12.5 mg 2 (two) times a week.  0   Multiple Vitamins-Minerals (CENTRUM ADULTS PO) Take by mouth.     No current facility-administered medications on file prior to visit.        ROS:  All others reviewed  and negative.  Objective        PE:  BP 138/86 (BP Location: Left Arm, Patient Position: Sitting, Cuff Size: Normal)   Pulse 78   Temp 98.2 F (36.8 C) (Oral)   Ht 5\' 6"  (1.676 m)   Wt 182 lb (82.6 kg)   SpO2 97%   BMI 29.38 kg/m                 Constitutional: Pt appears in NAD               HENT: Head: NCAT.                Right Ear: External ear normal.                 Left Ear: External ear normal.                Eyes: . Pupils are equal, round, and reactive to light. Conjunctivae and EOM are normal               Nose: without d/c or deformity               Neck: Neck supple. Gross normal ROM                Cardiovascular: Normal rate and regular rhythm.                 Pulmonary/Chest: Effort normal and breath sounds without rales or wheezing.                Abd:  Soft, NT, ND, + BS, no organomegaly               Neurological: Pt is alert. At baseline orientation, motor grossly intact               Skin: Skin is warm. No rashes, no other new lesions, LE edema - none               Psychiatric: Pt behavior is normal without agitation   Micro: none  Cardiac tracings I have personally interpreted today:  none  Pertinent Radiological findings (summarize): none   Lab Results  Component Value Date   WBC 7.0 10/31/2023   HGB 14.2 10/31/2023   HCT 42.8 10/31/2023   PLT 276.0 10/31/2023   GLUCOSE 98 10/31/2023   CHOL 164 10/31/2023   TRIG 53.0 10/31/2023   HDL 73.60 10/31/2023   LDLCALC 80 10/31/2023   ALT 36 (H) 10/31/2023   AST 26 10/31/2023   NA 142 10/31/2023   K 4.4 10/31/2023   CL 106 10/31/2023   CREATININE 0.78 10/31/2023   BUN 14 10/31/2023   CO2 29 10/31/2023   TSH 3.54 10/31/2023   HGBA1C 5.7 10/31/2023   MICROALBUR <0.7 10/31/2023   Assessment/Plan:  Linda Soto is a 82 y.o. Black or African American [2] female with  has a past medical history of Allergic rhinitis, Allergy, Dense breasts, Diverticular disease of left colon, Menopausal symptom (1997), Osteopenia, and RA (rheumatoid arthritis) (HCC).  Encounter for well adult exam with abnormal findings Age and sex appropriate education and counseling updated with regular exercise and diet Referrals for preventative services - none needed Immunizations addressed - declines covid booster, for shingrix and tdap at pharmacy Smoking counseling  - none needed Evidence for depression or other mood disorder - none significant Most recent labs reviewed. I have personally reviewed and have noted: 1) the patient's medical and social history  2) The patient's current medications and supplements 3) The patient's  height, weight, and BMI have been recorded in the chart   Hyperglycemia Lab Results  Component Value Date   HGBA1C 5.7 10/31/2023   Stable, pt to continue current medical treatment  - diet, wt control   Vitamin D deficiency Last vitamin D Lab Results  Component Value Date   VD25OH 87.89 10/31/2023   Stable, cont oral replacement   Subclinical hypothyroidism Lab Results  Component Value Date   TSH 3.54 10/31/2023   Stable, pt not needing current med tx  RA (rheumatoid arthritis) (HCC) No current synovitis, cont current med tx and f/u rheumatology  Followup: Return in about 1 year (around 10/30/2024).  Oliver Barre, MD 10/31/2023 6:49 PM [facility] [place] [facility]

## 2023-10-31 NOTE — Progress Notes (Signed)
The test results show that your current treatment is OK, as the tests are stable.  Please continue the same plan.  There is no other need for change of treatment or further evaluation based on these results, at this time.  thanks 

## 2023-10-31 NOTE — Assessment & Plan Note (Signed)
Last vitamin D Lab Results  Component Value Date   VD25OH 87.89 10/31/2023   Stable, cont oral replacement

## 2023-10-31 NOTE — Patient Instructions (Signed)

## 2023-10-31 NOTE — Assessment & Plan Note (Signed)
Age and sex appropriate education and counseling updated with regular exercise and diet Referrals for preventative services - none needed Immunizations addressed - declines covid booster, for shingrix and tdap at pharmacy Smoking counseling  - none needed Evidence for depression or other mood disorder - none significant Most recent labs reviewed. I have personally reviewed and have noted: 1) the patient's medical and social history 2) The patient's current medications and supplements 3) The patient's height, weight, and BMI have been recorded in the chart

## 2023-12-12 DIAGNOSIS — E663 Overweight: Secondary | ICD-10-CM | POA: Diagnosis not present

## 2023-12-12 DIAGNOSIS — Z6828 Body mass index (BMI) 28.0-28.9, adult: Secondary | ICD-10-CM | POA: Diagnosis not present

## 2023-12-12 DIAGNOSIS — M1991 Primary osteoarthritis, unspecified site: Secondary | ICD-10-CM | POA: Diagnosis not present

## 2023-12-12 DIAGNOSIS — Z79899 Other long term (current) drug therapy: Secondary | ICD-10-CM | POA: Diagnosis not present

## 2023-12-12 DIAGNOSIS — M0609 Rheumatoid arthritis without rheumatoid factor, multiple sites: Secondary | ICD-10-CM | POA: Diagnosis not present

## 2024-01-05 DIAGNOSIS — R7989 Other specified abnormal findings of blood chemistry: Secondary | ICD-10-CM | POA: Diagnosis not present

## 2024-01-07 DIAGNOSIS — Z01419 Encounter for gynecological examination (general) (routine) without abnormal findings: Secondary | ICD-10-CM | POA: Diagnosis not present

## 2024-01-07 DIAGNOSIS — Z1231 Encounter for screening mammogram for malignant neoplasm of breast: Secondary | ICD-10-CM | POA: Diagnosis not present

## 2024-01-07 DIAGNOSIS — Z780 Asymptomatic menopausal state: Secondary | ICD-10-CM | POA: Diagnosis not present

## 2024-01-07 DIAGNOSIS — Z1239 Encounter for other screening for malignant neoplasm of breast: Secondary | ICD-10-CM | POA: Diagnosis not present

## 2024-03-08 DIAGNOSIS — M0609 Rheumatoid arthritis without rheumatoid factor, multiple sites: Secondary | ICD-10-CM | POA: Diagnosis not present

## 2024-06-30 DIAGNOSIS — M1991 Primary osteoarthritis, unspecified site: Secondary | ICD-10-CM | POA: Diagnosis not present

## 2024-06-30 DIAGNOSIS — Z79899 Other long term (current) drug therapy: Secondary | ICD-10-CM | POA: Diagnosis not present

## 2024-06-30 DIAGNOSIS — Z6828 Body mass index (BMI) 28.0-28.9, adult: Secondary | ICD-10-CM | POA: Diagnosis not present

## 2024-06-30 DIAGNOSIS — E663 Overweight: Secondary | ICD-10-CM | POA: Diagnosis not present

## 2024-06-30 DIAGNOSIS — M0609 Rheumatoid arthritis without rheumatoid factor, multiple sites: Secondary | ICD-10-CM | POA: Diagnosis not present

## 2024-09-29 DIAGNOSIS — M0609 Rheumatoid arthritis without rheumatoid factor, multiple sites: Secondary | ICD-10-CM | POA: Diagnosis not present

## 2024-12-02 ENCOUNTER — Ambulatory Visit: Admitting: Internal Medicine

## 2024-12-02 ENCOUNTER — Encounter: Payer: Self-pay | Admitting: Internal Medicine

## 2024-12-02 ENCOUNTER — Ambulatory Visit: Payer: Self-pay | Admitting: Internal Medicine

## 2024-12-02 VITALS — BP 124/80 | HR 54 | Temp 98.3°F | Ht 66.0 in | Wt 183.0 lb

## 2024-12-02 DIAGNOSIS — E2839 Other primary ovarian failure: Secondary | ICD-10-CM

## 2024-12-02 DIAGNOSIS — M8580 Other specified disorders of bone density and structure, unspecified site: Secondary | ICD-10-CM | POA: Diagnosis not present

## 2024-12-02 DIAGNOSIS — E559 Vitamin D deficiency, unspecified: Secondary | ICD-10-CM | POA: Diagnosis not present

## 2024-12-02 DIAGNOSIS — E038 Other specified hypothyroidism: Secondary | ICD-10-CM

## 2024-12-02 DIAGNOSIS — Z0000 Encounter for general adult medical examination without abnormal findings: Secondary | ICD-10-CM | POA: Diagnosis not present

## 2024-12-02 DIAGNOSIS — R739 Hyperglycemia, unspecified: Secondary | ICD-10-CM

## 2024-12-02 LAB — URINALYSIS, ROUTINE W REFLEX MICROSCOPIC
Bilirubin Urine: NEGATIVE
Ketones, ur: NEGATIVE
Nitrite: NEGATIVE
Specific Gravity, Urine: 1.02 (ref 1.000–1.030)
Total Protein, Urine: NEGATIVE
Urine Glucose: NEGATIVE
Urobilinogen, UA: 0.2 (ref 0.0–1.0)
pH: 6 (ref 5.0–8.0)

## 2024-12-02 LAB — CBC WITH DIFFERENTIAL/PLATELET
Basophils Absolute: 0 K/uL (ref 0.0–0.1)
Basophils Relative: 0.6 % (ref 0.0–3.0)
Eosinophils Absolute: 0.1 K/uL (ref 0.0–0.7)
Eosinophils Relative: 2.2 % (ref 0.0–5.0)
HCT: 41.3 % (ref 36.0–46.0)
Hemoglobin: 14.1 g/dL (ref 12.0–15.0)
Lymphocytes Relative: 21.7 % (ref 12.0–46.0)
Lymphs Abs: 1.4 K/uL (ref 0.7–4.0)
MCHC: 34.2 g/dL (ref 30.0–36.0)
MCV: 95.8 fl (ref 78.0–100.0)
Monocytes Absolute: 0.5 K/uL (ref 0.1–1.0)
Monocytes Relative: 7.4 % (ref 3.0–12.0)
Neutro Abs: 4.4 K/uL (ref 1.4–7.7)
Neutrophils Relative %: 68.1 % (ref 43.0–77.0)
Platelets: 231 K/uL (ref 150.0–400.0)
RBC: 4.31 Mil/uL (ref 3.87–5.11)
RDW: 16.6 % — ABNORMAL HIGH (ref 11.5–15.5)
WBC: 6.5 K/uL (ref 4.0–10.5)

## 2024-12-02 LAB — BASIC METABOLIC PANEL WITH GFR
BUN: 11 mg/dL (ref 6–23)
CO2: 28 meq/L (ref 19–32)
Calcium: 9.3 mg/dL (ref 8.4–10.5)
Chloride: 105 meq/L (ref 96–112)
Creatinine, Ser: 0.78 mg/dL (ref 0.40–1.20)
GFR: 71.2 mL/min
Glucose, Bld: 87 mg/dL (ref 70–99)
Potassium: 3.7 meq/L (ref 3.5–5.1)
Sodium: 140 meq/L (ref 135–145)

## 2024-12-02 LAB — LIPID PANEL
Cholesterol: 152 mg/dL (ref 28–200)
HDL: 69.1 mg/dL
LDL Cholesterol: 73 mg/dL (ref 10–99)
NonHDL: 83.07
Total CHOL/HDL Ratio: 2
Triglycerides: 52 mg/dL (ref 10.0–149.0)
VLDL: 10.4 mg/dL (ref 0.0–40.0)

## 2024-12-02 LAB — HEMOGLOBIN A1C: Hgb A1c MFr Bld: 5.7 % (ref 4.6–6.5)

## 2024-12-02 LAB — HEPATIC FUNCTION PANEL
ALT: 28 U/L (ref 3–35)
AST: 25 U/L (ref 5–37)
Albumin: 4.2 g/dL (ref 3.5–5.2)
Alkaline Phosphatase: 90 U/L (ref 39–117)
Bilirubin, Direct: 0.1 mg/dL (ref 0.1–0.3)
Total Bilirubin: 0.5 mg/dL (ref 0.2–1.2)
Total Protein: 7.1 g/dL (ref 6.0–8.3)

## 2024-12-02 LAB — TSH: TSH: 4.42 u[IU]/mL (ref 0.35–5.50)

## 2024-12-02 NOTE — Assessment & Plan Note (Signed)
 Lab Results  Component Value Date   TSH 4.42 12/02/2024   Stable, pt does not require med tx at this time

## 2024-12-02 NOTE — Patient Instructions (Signed)
 Please continue all other medications as before, and refills have been done if requested.  Please have the pharmacy call with any other refills you may need.  Please continue your efforts at being more active, low cholesterol diet, and weight control.  You are otherwise up to date with prevention measures today.  Please keep your appointments with your specialists as you may have planned  You will be contacted regarding the referral for: Bone Density testing  Please go to the LAB at the blood drawing area for the tests to be done  You will be contacted by phone if any changes need to be made immediately.  Otherwise, you will receive a letter about your results with an explanation, but please check with MyChart first.  Please make an Appointment to return for your 1 year visit, or sooner if needed

## 2024-12-02 NOTE — Progress Notes (Signed)
 Patient ID: Linda Soto, female   DOB: 1941-08-27, 84 y.o.   MRN: [id number]         Chief Complaint:: wellness exam and hyperglycemia, osteopenia, low thyroid , low vit d       HPI:  Linda Soto is a 84 y.o. female here for wellness exam; due for DXA, o/w up to date                        Also Pt denies chest pain, increased sob or doe, wheezing, orthopnea, PND, increased LE swelling, palpitations, dizziness or syncope.   Pt denies polydipsia, polyuria, or new focal neuro s/s.    Pt denies fever, wt loss, night sweats, loss of appetite, or other constitutional symptoms     Wt Readings from Last 3 Encounters:  12/02/24 183 lb (83 kg)  10/31/23 182 lb (82.6 kg)  10/28/22 178 lb (80.7 kg)   BP Readings from Last 3 Encounters:  12/02/24 124/80  10/31/23 138/86  10/28/22 120/78   Immunization History  Administered Date(s) Administered   Fluad Quad(high Dose 65+) 08/27/2019, 09/27/2021, 09/24/2022   Fluad Trivalent(High Dose 65+) 10/15/2023   INFLUENZA, HIGH DOSE SEASONAL PF 09/01/2013, 09/20/2014, 09/29/2017, 09/23/2018   Influenza,inj,Quad PF,6+ Mos 09/04/2015   Influenza-Unspecified 10/01/2020   PFIZER(Purple Top)SARS-COV-2 Vaccination 12/03/2019, 12/24/2019, 08/08/2020, 07/26/2021   Pfizer Covid-19 Vaccine Bivalent Booster 60yrs & up 09/10/2022   Pneumococcal Conjugate-13 09/04/2015   Pneumococcal Polysaccharide-23 10/26/2021   Pneumococcal-Unspecified 11/11/2010   Tetanus 09/01/2013   Zoster Recombinant(Shingrix) 10/08/2017, 10/08/2017, 11/28/2017, 02/25/2018   Zoster, Live 10/27/2015   Health Maintenance Due  Topic Date Due   Medicare Annual Wellness (AWV)  05/07/2024   COVID-19 Vaccine (6 - 2025-26 season) 07/12/2024      Past Medical History:  Diagnosis Date   Allergic rhinitis    Allergy    Dense breasts    f/u mammo rec 2013 because of same   Diverticular disease of left colon    Menopausal symptom 1997   Osteopenia    at femur, follows DEXA with gyn q  65yrs   RA (rheumatoid arthritis) (HCC)    Past Surgical History:  Procedure Laterality Date   NO PAST SURGERIES      reports that she quit smoking about 51 years ago. Her smoking use included cigarettes. She has a 2.5 pack-year smoking history. She has never used smokeless tobacco. She reports current alcohol use. She reports that she does not use drugs. family history includes Arthritis in her mother; Breast cancer in her maternal aunt; Cancer in her father; Lung cancer in her maternal aunt; Myasthenia gravis (age of onset: 74) in her mother; Osteoporosis in her mother; Prostate cancer (age of onset: 58) in her father; Uterine cancer in her maternal aunt. Allergies[1] Medications Ordered Prior to Encounter[2]      ROS:  All others reviewed and negative.  Objective        PE:  BP 124/80 (BP Location: Left Arm, Patient Position: Sitting, Cuff Size: Normal)   Pulse (!) 54   Temp 98.3 F (36.8 C) (Oral)   Ht 5' 6 (1.676 m)   Wt 183 lb (83 kg)   SpO2 98%   BMI 29.54 kg/m                 Constitutional: Pt appears in NAD               HENT: Head: NCAT.  Right Ear: External ear normal.                 Left Ear: External ear normal.                Eyes: . Pupils are equal, round, and reactive to light. Conjunctivae and EOM are normal               Nose: without d/c or deformity               Neck: Neck supple. Gross normal ROM               Cardiovascular: Normal rate and regular rhythm.                 Pulmonary/Chest: Effort normal and breath sounds without rales or wheezing.                Abd:  Soft, NT, ND, + BS, no organomegaly               Neurological: Pt is alert. At baseline orientation, motor grossly intact               Skin: Skin is warm. No rashes, no other new lesions, LE edema - none               Psychiatric: Pt behavior is normal without agitation   Micro: none  Cardiac tracings I have personally interpreted today:  none  Pertinent Radiological  findings (summarize): none   Lab Results  Component Value Date   WBC 6.5 12/02/2024   HGB 14.1 12/02/2024   HCT 41.3 12/02/2024   PLT 231.0 12/02/2024   GLUCOSE 87 12/02/2024   CHOL 152 12/02/2024   TRIG 52.0 12/02/2024   HDL 69.10 12/02/2024   LDLCALC 73 12/02/2024   ALT 28 12/02/2024   AST 25 12/02/2024   NA 140 12/02/2024   K 3.7 12/02/2024   CL 105 12/02/2024   CREATININE 0.78 12/02/2024   BUN 11 12/02/2024   CO2 28 12/02/2024   TSH 4.42 12/02/2024   HGBA1C 5.7 12/02/2024   Assessment/Plan:  Linda Soto is a 84 y.o. Black or African American [2] female with  has a past medical history of Allergic rhinitis, Allergy, Dense breasts, Diverticular disease of left colon, Menopausal symptom (1997), Osteopenia, and RA (rheumatoid arthritis) (HCC).  Preventative health care Age and sex appropriate education and counseling updated with regular exercise and diet Referrals for preventative services - due for DXA Immunizations addressed - none needed Smoking counseling  - none needed Evidence for depression or other mood disorder - none significant Most recent labs reviewed. I have personally reviewed and have noted: 1) the patient's medical and social history 2) The patient's current medications and supplements 3) The patient's height, weight, and BMI have been recorded in the chart   Hyperglycemia Lab Results  Component Value Date   HGBA1C 5.7 12/02/2024   Stable, pt to continue current medical treatment  - diet, wt control  Osteopenia Due for f/uy DXA as GYN has retired  Subclinical hypothyroidism Lab Results  Component Value Date   TSH 4.42 12/02/2024   Stable, pt does not require med tx at this time  Vitamin D  deficiency Last vitamin D  Lab Results  Component Value Date   VD25OH 87.89 10/31/2023   Stable, cont oral replacement  Followup: Return in about 1 year (around 12/02/2025).  Lynwood Rush, MD 12/02/2024 7:57 PM [facility] [place]  [facility] - [facility]     [1] No Known Allergies [2]  Current Outpatient Medications on File Prior to Visit  Medication Sig Dispense Refill   calcium citrate-vitamin D  (CITRACAL+D) 315-200 MG-UNIT tablet Take 1 tablet by mouth 2 (two) times daily.     cholecalciferol (VITAMIN D3) 25 MCG (1000 UNIT) tablet      folic acid (FOLVITE) 1 MG tablet Take 1 mg by mouth daily.  0   methotrexate (RHEUMATREX) 2.5 MG tablet 12.5 mg 2 (two) times a week.  0   Multiple Vitamins-Minerals (CENTRUM ADULTS PO) Take by mouth.     No current facility-administered medications on file prior to visit.

## 2024-12-02 NOTE — Assessment & Plan Note (Signed)
 Last vitamin D Lab Results  Component Value Date   VD25OH 87.89 10/31/2023   Stable, cont oral replacement

## 2024-12-02 NOTE — Progress Notes (Signed)
 The test results show that your current treatment is OK, as the tests are stable.  Please continue the same plan.  There is no other need for change of treatment or further evaluation based on these results, at this time.  thanks

## 2024-12-02 NOTE — Assessment & Plan Note (Signed)
 Age and sex appropriate education and counseling updated with regular exercise and diet Referrals for preventative services - due for DXA Immunizations addressed - none needed Smoking counseling  - none needed Evidence for depression or other mood disorder - none significant Most recent labs reviewed. I have personally reviewed and have noted: 1) the patient's medical and social history 2) The patient's current medications and supplements 3) The patient's height, weight, and BMI have been recorded in the chart

## 2024-12-02 NOTE — Assessment & Plan Note (Addendum)
 Due for f/uy DXA as GYN has retired

## 2024-12-02 NOTE — Assessment & Plan Note (Signed)
 Lab Results  Component Value Date   HGBA1C 5.7 12/02/2024   Stable, pt to continue current medical treatment  - diet, wt control
